# Patient Record
Sex: Female | Born: 1980 | Race: White | Hispanic: No | Marital: Single | Smoking: Never smoker
Health system: Southern US, Community
[De-identification: ages and names within clinical notes are randomized; demographics above are authoritative.]

## PROBLEM LIST (undated history)

## (undated) DIAGNOSIS — F329 Major depressive disorder, single episode, unspecified: Secondary | ICD-10-CM

## (undated) DIAGNOSIS — F32A Depression, unspecified: Secondary | ICD-10-CM

## (undated) DIAGNOSIS — J45909 Unspecified asthma, uncomplicated: Secondary | ICD-10-CM

## (undated) DIAGNOSIS — F419 Anxiety disorder, unspecified: Secondary | ICD-10-CM

## (undated) DIAGNOSIS — N938 Other specified abnormal uterine and vaginal bleeding: Secondary | ICD-10-CM

## (undated) DIAGNOSIS — R87810 Cervical high risk human papillomavirus (HPV) DNA test positive: Secondary | ICD-10-CM

## (undated) HISTORY — DX: Major depressive disorder, single episode, unspecified: F32.9

## (undated) HISTORY — DX: Unspecified asthma, uncomplicated: J45.909

## (undated) HISTORY — DX: Other specified abnormal uterine and vaginal bleeding: N93.8

## (undated) HISTORY — DX: Cervical high risk human papillomavirus (HPV) DNA test positive: R87.810

## (undated) HISTORY — DX: Depression, unspecified: F32.A

## (undated) HISTORY — DX: Anxiety disorder, unspecified: F41.9

---

## 2007-12-23 ENCOUNTER — Emergency Department: Payer: Self-pay | Admitting: Emergency Medicine
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
## 2010-04-09 ENCOUNTER — Emergency Department: Payer: Self-pay | Admitting: Emergency Medicine

## 2010-06-04 DIAGNOSIS — R87810 Cervical high risk human papillomavirus (HPV) DNA test positive: Secondary | ICD-10-CM

## 2010-06-04 HISTORY — DX: Cervical high risk human papillomavirus (HPV) DNA test positive: R87.810

## 2010-06-04 HISTORY — PX: COLPOSCOPY: SHX161

## 2010-08-07 ENCOUNTER — Emergency Department: Payer: Self-pay | Admitting: Emergency Medicine
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
## 2012-12-17 ENCOUNTER — Emergency Department: Payer: Self-pay | Admitting: Internal Medicine

## 2016-09-10 ENCOUNTER — Ambulatory Visit (INDEPENDENT_AMBULATORY_CARE_PROVIDER_SITE_OTHER): Payer: BLUE CROSS/BLUE SHIELD | Admitting: Obstetrics and Gynecology

## 2016-09-10 ENCOUNTER — Encounter: Payer: Self-pay | Admitting: Obstetrics and Gynecology

## 2016-09-10 VITALS — BP 130/88 | HR 99 | Ht 66.0 in | Wt 242.0 lb

## 2016-09-10 DIAGNOSIS — F418 Other specified anxiety disorders: Secondary | ICD-10-CM | POA: Diagnosis not present

## 2016-09-10 DIAGNOSIS — F329 Major depressive disorder, single episode, unspecified: Secondary | ICD-10-CM

## 2016-09-10 DIAGNOSIS — Z3042 Encounter for surveillance of injectable contraceptive: Secondary | ICD-10-CM

## 2016-09-10 DIAGNOSIS — F419 Anxiety disorder, unspecified: Secondary | ICD-10-CM

## 2016-09-10 DIAGNOSIS — Z01419 Encounter for gynecological examination (general) (routine) without abnormal findings: Secondary | ICD-10-CM | POA: Diagnosis not present

## 2016-09-10 MED ORDER — VENLAFAXINE HCL ER 150 MG PO CP24: 150.0000 mg | ORAL_CAPSULE | Freq: Every day | ORAL | 12 refills | Status: DC

## 2016-09-10 MED ORDER — MEDROXYPROGESTERONE ACETATE 150 MG/ML IM SUSY: 150.0000 mg | PREFILLED_SYRINGE | INTRAMUSCULAR | 3 refills | Status: DC

## 2016-09-10 NOTE — Progress Notes (Signed)
HPI:      [name] is a 36 y.o. G0P0000 who LMP was No LMP recorded. Patient has had an injection., presents today for her annual examination.  Her menses are absent on depo.  Dysmenorrhea none. She does not have intermenstrual bleeding.  Sex activity: not sexually active.  Last Pap: July 29, 2014  Results were: no abnormalities /neg HPV DNA  Hx of STDs: HPV   There is no FH of breast cancer. There is no FH of ovarian cancer. The patient does do self-breast exams.  Tobacco use: The patient denies current or previous tobacco use. Alcohol use: social drinker Exercise: moderately active  She does get adequate calcium and Vitamin D in her diet.  She has a hx of anxiety/depression/anger issues. She is doing well with effexor daily. She denies any side effects and wants to cont.   Past Medical History:  Diagnosis Date  . Anxiety and depression   . Asthma   . Cervical high risk human papillomavirus (HPV) DNA test positive 2012    Past Surgical History:  Procedure Laterality Date  . COLPOSCOPY  2012    Family History  Problem Relation Age of Onset  . Diabetes type II Father   . Schizophrenia Maternal Grandmother      ROS:  Review of Systems  Constitutional: Negative for fever, malaise/fatigue and weight loss.  HENT: Negative for congestion, ear pain and sinus pain.   Respiratory: Negative for cough, shortness of breath and wheezing.   Cardiovascular: Negative for chest pain, orthopnea and leg swelling.  Gastrointestinal: Negative for constipation, diarrhea, nausea and vomiting.  Genitourinary: Negative for dysuria, frequency, hematuria and urgency.       Breast ROS: negative   Musculoskeletal: Negative for back pain, joint pain and myalgias.  Skin: Negative for itching and rash.  Neurological: Negative for dizziness, tingling, focal weakness and headaches.  Endo/Heme/Allergies: Negative for environmental allergies. Does not bruise/bleed easily.    Psychiatric/Behavioral: Negative for depression and suicidal ideas. The patient is not nervous/anxious and does not have insomnia.     Objective: BP 130/88 (BP Location: Left Arm, Patient Position: Sitting, Cuff Size: Large)   Pulse 99   Ht  (1.676 m)   Wt 242 lb (109.8 kg)   BMI 39.06 kg/m    Physical Exam  Constitutional: She is oriented to person, place, and time. She appears well-developed and well-nourished.  Genitourinary: Vagina normal and uterus normal. No erythema or tenderness in the vagina. No vaginal discharge found. Right adnexum does not display mass and does not display tenderness. Left adnexum does not display mass and does not display tenderness. Cervix does not exhibit motion tenderness or polyp. Uterus is not enlarged or tender.  Neck: Normal range of motion. No thyromegaly present.  Cardiovascular: Normal rate, regular rhythm and normal heart sounds.   No murmur heard. Pulmonary/Chest: Effort normal and breath sounds normal. Right breast exhibits no mass, no nipple discharge, no skin change and no tenderness. Left breast exhibits no mass, no nipple discharge, no skin change and no tenderness.  Abdominal: Soft. There is no tenderness. There is no guarding.  Musculoskeletal: Normal range of motion.  Neurological: She is alert and oriented to person, place, and time. No cranial nerve deficit.  Psychiatric: She has a normal mood and affect. Her behavior is normal.  Vitals reviewed.   Assessment/Plan: Encounter for annual routine gynecological examination  Encounter for surveillance of injectable contraceptive - Plan: MedroxyPROGESTERone Acetate 150 MG/ML SUSY  Anxiety and depression - Doing well with effexor. Rx RF. F/u prn. - Plan: venlafaxine XR (EFFEXOR-XR) 150 MG 24 hr capsule              GYN counsel adequate intake of calcium and vitamin D, diet and exercise     F/U  Return in about 1 year (around 09/10/2017).  Alicia B. Copland,  PA-C 09/10/2016 11:24 AM

## 2017-09-10 ENCOUNTER — Encounter: Payer: Self-pay | Admitting: Obstetrics and Gynecology

## 2017-09-11 ENCOUNTER — Ambulatory Visit: Payer: BLUE CROSS/BLUE SHIELD | Admitting: Obstetrics and Gynecology

## 2017-09-11 ENCOUNTER — Ambulatory Visit (INDEPENDENT_AMBULATORY_CARE_PROVIDER_SITE_OTHER): Payer: PRIVATE HEALTH INSURANCE | Admitting: Obstetrics and Gynecology

## 2017-09-11 ENCOUNTER — Encounter: Payer: Self-pay | Admitting: Obstetrics and Gynecology

## 2017-09-11 VITALS — BP 124/84 | HR 90 | Ht 64.0 in | Wt 224.0 lb

## 2017-09-11 DIAGNOSIS — F329 Major depressive disorder, single episode, unspecified: Secondary | ICD-10-CM | POA: Diagnosis not present

## 2017-09-11 DIAGNOSIS — Z01419 Encounter for gynecological examination (general) (routine) without abnormal findings: Secondary | ICD-10-CM

## 2017-09-11 DIAGNOSIS — Z124 Encounter for screening for malignant neoplasm of cervix: Secondary | ICD-10-CM

## 2017-09-11 DIAGNOSIS — Z1151 Encounter for screening for human papillomavirus (HPV): Secondary | ICD-10-CM | POA: Diagnosis not present

## 2017-09-11 DIAGNOSIS — Z113 Encounter for screening for infections with a predominantly sexual mode of transmission: Secondary | ICD-10-CM

## 2017-09-11 DIAGNOSIS — Z3042 Encounter for surveillance of injectable contraceptive: Secondary | ICD-10-CM | POA: Diagnosis not present

## 2017-09-11 DIAGNOSIS — F419 Anxiety disorder, unspecified: Secondary | ICD-10-CM | POA: Diagnosis not present

## 2017-09-11 MED ORDER — SERTRALINE HCL 50 MG PO TABS: ORAL_TABLET | ORAL | 1 refills | Status: DC

## 2017-09-11 MED ORDER — MEDROXYPROGESTERONE ACETATE 150 MG/ML IM SUSY: 150.0000 mg | PREFILLED_SYRINGE | INTRAMUSCULAR | 3 refills | Status: DC

## 2017-09-11 NOTE — Progress Notes (Signed)
HPI:      [name] is a 37 y.o. G0P0000 who LMP was No LMP recorded. Patient has had an injection., presents today for her annual examination. Her menses are absent on depo.  Dysmenorrhea none. She does not have intermenstrual bleeding.  Sex activity: not sexually active but had 1 sex encounter with a condom a couple wks ago. Last Pap: July 29, 2014  Results were: no abnormalities /neg HPV DNA  Hx of STDs: HPV  There is no FH of breast cancer. There is no FH of ovarian cancer. The patient does do self-breast exams.  Tobacco use: The patient denies current or previous tobacco use. Alcohol use: social drinker Exercise: moderately active  She does not get adequate calcium and Vitamin D in her diet.  She has a hx of anxiety/depression/anger issues. She stopped effexor about 3-6 months ago due to lack of insurance. She would like to restart zoloft. She was on about 150 mg dose in past and feels it helped better with anxiety than effexor. Both have helped with depression sx. She denies any side effects with effexor.   Past Medical History:  Diagnosis Date  . Anxiety and depression   . Asthma   . Cervical high risk human papillomavirus (HPV) DNA test positive 2012  . Depression   . DUB (dysfunctional uterine bleeding)     Past Surgical History:  Procedure Laterality Date  . COLPOSCOPY  2012    Family History  Problem Relation Age of Onset  . Diabetes type II Father   . Schizophrenia Maternal Grandmother   . Hypertension Mother   . Breast cancer Other     Social History   Socioeconomic History  . Marital status: Single    Spouse name: Not on file  . Number of children: Not on file  . Years of education: Not on file  . Highest education level: Not on file  Occupational History  . Not on file  Social Needs  . Financial resource strain: Not on file  . Food insecurity:    Worry: Not on file    Inability: Not on file  . Transportation needs:   Medical: Not on file    Non-medical: Not on file  Tobacco Use  . Smoking status: Never Smoker  . Smokeless tobacco: Never Used  Substance and Sexual Activity  . Alcohol use: Yes  . Drug use: No  . Sexual activity: Not Currently    Birth control/protection: Injection  Lifestyle  . Physical activity:    Days per week: Not on file    Minutes per session: Not on file  . Stress: Not on file  Relationships  . Social connections:    Talks on phone: Not on file    Gets together: Not on file    Attends religious service: Not on file    Active member of club or organization: Not on file    Attends meetings of clubs or organizations: Not on file    Relationship status: Not on file  . Intimate partner violence:    Fear of current or ex partner: Not on file    Emotionally abused: Not on file    Physically abused: Not on file    Forced sexual activity: Not on file  Other Topics Concern  . Not on file  Social History Narrative  . Not on file    No current outpatient medications on file prior to visit.   No current facility-administered medications on  file prior to visit.     ROS:  Review of Systems  Constitutional: Positive for fatigue. Negative for fever and unexpected weight change.  Respiratory: Positive for cough. Negative for shortness of breath and wheezing.   Cardiovascular: Negative for chest pain, palpitations and leg swelling.  Gastrointestinal: Negative for blood in stool, constipation, diarrhea, nausea and vomiting.  Endocrine: Negative for cold intolerance, heat intolerance and polyuria.  Genitourinary: Negative for dyspareunia, dysuria, flank pain, frequency, genital sores, hematuria, menstrual problem, pelvic pain, urgency, vaginal bleeding, vaginal discharge and vaginal pain.  Musculoskeletal: Negative for back pain, joint swelling and myalgias.  Skin: Negative for rash.  Neurological: Positive for headaches. Negative for dizziness, syncope, light-headedness and  numbness.  Hematological: Negative for adenopathy.  Psychiatric/Behavioral: Positive for agitation and dysphoric mood. Negative for confusion, sleep disturbance and suicidal ideas. The patient is not nervous/anxious.      Objective: BP 124/84   Pulse 90   Ht 5\' 4"  (1.626 m)   Wt 224 lb (101.6 kg)   BMI 38.45 kg/m    Physical Exam  Constitutional: She is oriented to person, place, and time. She appears well-developed and well-nourished.  Genitourinary: Vagina normal and uterus normal. There is no rash or tenderness on the right labia. There is no rash or tenderness on the left labia. No erythema or tenderness in the vagina. No vaginal discharge found. Right adnexum does not display mass and does not display tenderness. Left adnexum does not display mass and does not display tenderness. Cervix does not exhibit motion tenderness or polyp. Uterus is not enlarged or tender.  Neck: Normal range of motion. No thyromegaly present.  Cardiovascular: Normal rate, regular rhythm and normal heart sounds.  No murmur heard. Pulmonary/Chest: Effort normal and breath sounds normal. Right breast exhibits no mass, no nipple discharge, no skin change and no tenderness. Left breast exhibits no mass, no nipple discharge, no skin change and no tenderness.  Abdominal: Soft. There is no tenderness. There is no guarding.  Musculoskeletal: Normal range of motion.  Neurological: She is alert and oriented to person, place, and time. No cranial nerve deficit.  Psychiatric: She has a normal mood and affect. Her behavior is normal.  Vitals reviewed.   Assessment/Plan: Encounter for annual routine gynecological examination  Cervical cancer screening - Plan: IGP,CtNgTv,Apt HPV,rfx16/18,45, CANCELED: IGP, Aptima HPV  Screening for HPV (human papillomavirus) - Plan: IGP,CtNgTv,Apt HPV,rfx16/18,45, CANCELED: IGP, Aptima HPV  Screening for STD (sexually transmitted disease) - Plan: IGP,CtNgTv,Apt  HPV,rfx16/18,45  Encounter for surveillance of injectable contraceptive - Rx RF depo.  - Plan: medroxyPROGESTERone Acetate 150 MG/ML SUSY  Anxiety and depression - Restart zoloft. Rx eRxd. F/u via phone after being on meds for 6 wks with sx f/u.  - Plan: sertraline (ZOLOFT) 50 MG tablet  Meds ordered this encounter  Medications  . medroxyPROGESTERone Acetate 150 MG/ML SUSY    Sig: Inject 1 mL (150 mg total) as directed every 3 (three) months.    Dispense:  1 Syringe    Refill:  3    Order Specific Question:   Supervising Provider    Answer:   Nadara Mustard [id number]  . sertraline (ZOLOFT) 50 MG tablet    Sig: Take 1/2 tab daily for 6 days, then 1 tab daily for 1 wk, then 2 tabs daily    Dispense:  60 tablet    Refill:  1    Order Specific Question:   Supervising Provider    Answer:   Nadara Mustard [  [id number]]             GYN counsel adequate intake of calcium and vitamin D, diet and exercise     F/U  Return in about 1 year (around 09/12/2018).  Lugenia Assefa B. Arjen Deringer, PA-C 09/11/2017 9:33 AM

## 2017-09-11 NOTE — Patient Instructions (Signed)
I value your feedback and entrusting us with your care. If you get a  patient survey, I would appreciate you taking the time to let us know about your experience today. Thank you! 

## 2017-09-14 LAB — IGP,CTNGTV,APT HPV,RFX16/18,45
CHLAMYDIA, NUC. ACID AMP: NEGATIVE
Gonococcus, Nuc. Acid Amp: NEGATIVE
HPV APTIMA: NEGATIVE
PAP SMEAR COMMENT: 0
Trich vag by NAA: NEGATIVE

## 2017-10-02 ENCOUNTER — Telehealth: Payer: Self-pay

## 2017-10-02 DIAGNOSIS — Z3042 Encounter for surveillance of injectable contraceptive: Secondary | ICD-10-CM

## 2017-10-02 DIAGNOSIS — F419 Anxiety disorder, unspecified: Principal | ICD-10-CM

## 2017-10-02 DIAGNOSIS — F32A Depression, unspecified: Secondary | ICD-10-CM

## 2017-10-02 DIAGNOSIS — F329 Major depressive disorder, single episode, unspecified: Secondary | ICD-10-CM

## 2017-10-02 MED ORDER — SERTRALINE HCL 50 MG PO TABS: ORAL_TABLET | ORAL | 1 refills | Status: DC

## 2017-10-02 MED ORDER — MEDROXYPROGESTERONE ACETATE 150 MG/ML IM SUSY: 150.0000 mg | PREFILLED_SYRINGE | INTRAMUSCULAR | 3 refills | Status: DC

## 2017-10-02 NOTE — Telephone Encounter (Signed)
Pt calling to see where we sent her rxs.  They were supposed to be sent to [facility]  [address].  [phone]  Sent rxs to this pharm today. Pt aware.

## 2017-10-31 ENCOUNTER — Other Ambulatory Visit: Payer: Self-pay | Admitting: Obstetrics and Gynecology

## 2017-10-31 ENCOUNTER — Telehealth: Payer: Self-pay

## 2017-10-31 DIAGNOSIS — F329 Major depressive disorder, single episode, unspecified: Secondary | ICD-10-CM

## 2017-10-31 DIAGNOSIS — F32A Depression, unspecified: Secondary | ICD-10-CM

## 2017-10-31 DIAGNOSIS — F419 Anxiety disorder, unspecified: Principal | ICD-10-CM

## 2017-10-31 MED ORDER — SERTRALINE HCL 100 MG PO TABS: ORAL_TABLET | ORAL | 0 refills | Status: DC

## 2017-10-31 NOTE — Telephone Encounter (Signed)
Pt can take 1 1/2 tabs daily (150 mg daily). Currently has 50 mg Rx and taking 2. I will send in 100 mg Rx to take 1 1/2. Pt to f/u via phone with me re: sx in 4 wks. Rx for 1 mo only. Thx.

## 2017-10-31 NOTE — Telephone Encounter (Signed)
Pt aware via vm , thank you

## 2017-10-31 NOTE — Telephone Encounter (Signed)
Pt calling triage requesting an increase on her Zoloft. Was told to call if she felt like she needed it to be increased. Please advise/increase Zoloft. I am here the rest of the day today then only half day Friday. If it is after noon Friday, send to [facility] to call pt back please.

## 2017-10-31 NOTE — Progress Notes (Signed)
Rx zoloft increase to 150 mg dose daily for sx

## 2017-11-20 ENCOUNTER — Telehealth: Payer: Self-pay

## 2017-11-20 NOTE — Telephone Encounter (Signed)
Pt is requesting an increase on her Zoloft from [facility]. She is currently out of meds & needs ASAP. Cb#[phone]

## 2017-11-21 NOTE — Telephone Encounter (Signed)
Does she need to be seen?

## 2017-11-21 NOTE — Telephone Encounter (Signed)
Pt taking 100 mg zoloft dose but wants to go back to 150 mg dose. I got a msg about that 10/31/17 and sent in the 150 mg dose, but pt never got msg or Rx. Pt to start 150 mg dose and f/u with sx. Controlled sx in past but wanted to increase gradually from 4/19 appt.

## 2018-01-06 ENCOUNTER — Encounter: Payer: Self-pay | Admitting: Obstetrics and Gynecology

## 2018-07-04 ENCOUNTER — Encounter: Payer: Self-pay | Admitting: Obstetrics and Gynecology

## 2018-08-16 ENCOUNTER — Encounter: Payer: Self-pay | Admitting: Obstetrics and Gynecology

## 2018-11-25 ENCOUNTER — Ambulatory Visit: Payer: BLUE CROSS/BLUE SHIELD | Admitting: Obstetrics and Gynecology

## 2019-01-04 ENCOUNTER — Encounter: Payer: Self-pay | Admitting: Obstetrics and Gynecology

## 2019-01-06 ENCOUNTER — Ambulatory Visit: Payer: BLUE CROSS/BLUE SHIELD | Admitting: Obstetrics and Gynecology

## 2019-01-13 ENCOUNTER — Ambulatory Visit: Payer: BLUE CROSS/BLUE SHIELD | Admitting: Obstetrics and Gynecology

## 2019-02-11 NOTE — Progress Notes (Deleted)
HPI:      [name] is a 38 y.o. G0P0000 who LMP was No LMP recorded. Patient has had an injection., presents today for her annual examination. Her menses are absent on depo.  Dysmenorrhea none. She does not have intermenstrual bleeding.  Sex activity: not sexually active but had 1 sex encounter with a condom a couple wks ago. Last Pap: 09/11/17  Results were: no abnormalities /neg HPV DNA  Hx of STDs: HPV  There is no FH of breast cancer. There is no FH of ovarian cancer. The patient does do self-breast exams.  Tobacco use: The patient denies current or previous tobacco use. Alcohol use: social drinker Exercise: moderately active  She does not get adequate calcium and Vitamin D in her diet.  She has a hx of anxiety/depression/anger issues. She stopped effexor about 3-6 months ago due to lack of insurance. She would like to restart zoloft. She was on about 150 mg dose in past and feels it helped better with anxiety than effexor. Both have helped with depression sx. She denies any side effects with effexor.   Past Medical History:  Diagnosis Date  . Anxiety and depression   . Asthma   . Cervical high risk human papillomavirus (HPV) DNA test positive 2012  . Depression   . DUB (dysfunctional uterine bleeding)     Past Surgical History:  Procedure Laterality Date  . COLPOSCOPY  2012    Family History  Problem Relation Age of Onset  . Diabetes type II Father   . Schizophrenia Maternal Grandmother   . Hypertension Mother   . Breast cancer Other     Social History   Socioeconomic History  . Marital status: Single    Spouse name: Not on file  . Number of children: Not on file  . Years of education: Not on file  . Highest education level: Not on file  Occupational History  . Not on file  Social Needs  . Financial resource strain: Not on file  . Food insecurity    Worry: Not on file    Inability: Not on file  . Transportation needs    Medical: Not on  file    Non-medical: Not on file  Tobacco Use  . Smoking status: Never Smoker  . Smokeless tobacco: Never Used  Substance and Sexual Activity  . Alcohol use: Yes  . Drug use: No  . Sexual activity: Not Currently    Birth control/protection: Injection  Lifestyle  . Physical activity    Days per week: Not on file    Minutes per session: Not on file  . Stress: Not on file  Relationships  . Social Musicianconnections    Talks on phone: Not on file    Gets together: Not on file    Attends religious service: Not on file    Active member of club or organization: Not on file    Attends meetings of clubs or organizations: Not on file    Relationship status: Not on file  . Intimate partner violence    Fear of current or ex partner: Not on file    Emotionally abused: Not on file    Physically abused: Not on file    Forced sexual activity: Not on file  Other Topics Concern  . Not on file  Social History Narrative  . Not on file    Current Outpatient Medications on File Prior to Visit  Medication Sig Dispense Refill  . medroxyPROGESTERone  Acetate 150 MG/ML SUSY Inject 1 mL (150 mg total) as directed every 3 (three) months. 1 Syringe 3  . sertraline (ZOLOFT) 100 MG tablet Take 1 1/2 tabs daily 45 tablet 0   No current facility-administered medications on file prior to visit.     ROS:  Review of Systems  Constitutional: Positive for fatigue. Negative for fever and unexpected weight change.  Respiratory: Positive for cough. Negative for shortness of breath and wheezing.   Cardiovascular: Negative for chest pain, palpitations and leg swelling.  Gastrointestinal: Negative for blood in stool, constipation, diarrhea, nausea and vomiting.  Endocrine: Negative for cold intolerance, heat intolerance and polyuria.  Genitourinary: Negative for dyspareunia, dysuria, flank pain, frequency, genital sores, hematuria, menstrual problem, pelvic pain, urgency, vaginal bleeding, vaginal discharge and  vaginal pain.  Musculoskeletal: Negative for back pain, joint swelling and myalgias.  Skin: Negative for rash.  Neurological: Positive for headaches. Negative for dizziness, syncope, light-headedness and numbness.  Hematological: Negative for adenopathy.  Psychiatric/Behavioral: Positive for agitation and dysphoric mood. Negative for confusion, sleep disturbance and suicidal ideas. The patient is not nervous/anxious.      Objective: There were no vitals taken for this visit.   Physical Exam Constitutional:      Appearance: She is well-developed.  Genitourinary:     Vagina and uterus normal.     No vaginal discharge, erythema or tenderness.     No cervical motion tenderness or polyp.     Uterus is not enlarged or tender.     No right or left adnexal mass present.     Right adnexa not tender.     Left adnexa not tender.  Neck:     Musculoskeletal: Normal range of motion.     Thyroid: No thyromegaly.  Cardiovascular:     Rate and Rhythm: Normal rate and regular rhythm.     Heart sounds: Normal heart sounds. No murmur.  Pulmonary:     Effort: Pulmonary effort is normal.     Breath sounds: Normal breath sounds.  Chest:     Breasts:        Right: No mass, nipple discharge, skin change or tenderness.        Left: No mass, nipple discharge, skin change or tenderness.  Abdominal:     Palpations: Abdomen is soft.     Tenderness: There is no abdominal tenderness. There is no guarding.  Musculoskeletal: Normal range of motion.  Neurological:     Mental Status: She is alert and oriented to person, place, and time.     Cranial Nerves: No cranial nerve deficit.  Psychiatric:        Behavior: Behavior normal.  Vitals signs reviewed.     Assessment/Plan: No diagnosis found.  No orders of the defined types were placed in this encounter.            GYN counsel adequate intake of calcium and vitamin D, diet and exercise     F/U  No follow-ups on file.  Arad Burston B. Averiana Clouatre,  PA-C 02/11/2019 3:12 PM

## 2019-02-12 ENCOUNTER — Ambulatory Visit: Payer: BLUE CROSS/BLUE SHIELD | Admitting: Obstetrics and Gynecology

## 2019-06-03 NOTE — Progress Notes (Signed)
Chief Complaint  Patient presents with  . Gynecologic Exam     HPI:      [name] is a 38 y.o. G0P0000 who LMP was Patient's last menstrual period was 05/29/2019 (exact date)., presents today for her annual examination. Her menses are monthly now off depo, lasting 3-5 days, with dime sized clots, med flow.  Dysmenorrhea mod to severe (for pt), improved with NSAIDs. She does not have intermenstrual bleeding.  Complains of severe vaginal itching before menses and then a few days afterwards. Wears pads and sx occur if flow is heavier that cycle. Has vaginal d/c, too. Treats with OTC monistat with temporary relief. Using unscented soaps, pads, no dryer sheets.  Sex activity: occas sexually active, using condoms. Declines BC for now. Last Pap: 09/11/17  Results were: no abnormalities /neg HPV DNA  Hx of STDs: HPV  There is no FH of breast cancer. There is no FH of ovarian cancer. The patient does do self-breast exams.  Tobacco use: The patient denies current or previous tobacco use. Alcohol use: social drinker No drug use Exercise: moderately active  She does get adequate calcium and Vitamin D in her diet.  She has a hx of anxiety/depression/anger issues. She stopped zoloft awhile back. Starting to have panic attacks again, mind doesn't shut off, and trouble sleeping. Taking melatonin without relief. Thinks she needs to restart sertraline again which worked well in past at 150 mg dose. Did effexor but didn't work as well as zoloft.   No recent labs  Past Medical History:  Diagnosis Date  . Anxiety and depression   . Asthma   . Cervical high risk human papillomavirus (HPV) DNA test positive 2012  . Depression   . DUB (dysfunctional uterine bleeding)     Past Surgical History:  Procedure Laterality Date  . COLPOSCOPY  2012    Family History  Problem Relation Age of Onset  . Diabetes type II Father   . Schizophrenia Maternal Grandmother   . Hypertension  Mother   . Breast cancer Other     Social History   Socioeconomic History  . Marital status: Single    Spouse name: Not on file  . Number of children: Not on file  . Years of education: Not on file  . Highest education level: Not on file  Occupational History  . Not on file  Tobacco Use  . Smoking status: Never Smoker  . Smokeless tobacco: Never Used  Substance and Sexual Activity  . Alcohol use: Yes  . Drug use: No  . Sexual activity: Yes    Birth control/protection: None  Other Topics Concern  . Not on file  Social History Narrative  . Not on file   Social Determinants of Health   Financial Resource Strain:   . Difficulty of Paying Living Expenses: Not on file  Food Insecurity:   . Worried About Programme researcher, broadcasting/film/videounning Out of Food in the Last Year: Not on file  . Ran Out of Food in the Last Year: Not on file  Transportation Needs:   . Lack of Transportation (Medical): Not on file  . Lack of Transportation (Non-Medical): Not on file  Physical Activity:   . Days of Exercise per Week: Not on file  . Minutes of Exercise per Session: Not on file  Stress:   . Feeling of Stress : Not on file  Social Connections:   . Frequency of Communication with Friends and Family: Not on file  . Frequency  of Social Gatherings with Friends and Family: Not on file  . Attends Religious Services: Not on file  . Active Member of Clubs or Organizations: Not on file  . Attends Archivist Meetings: Not on file  . Marital Status: Not on file  Intimate Partner Violence:   . Fear of Current or Ex-Partner: Not on file  . Emotionally Abused: Not on file  . Physically Abused: Not on file  . Sexually Abused: Not on file    No current outpatient medications on file prior to visit.   No current facility-administered medications on file prior to visit.    ROS:  Review of Systems  Constitutional: Positive for fatigue. Negative for fever and unexpected weight change.  Respiratory: Negative for  cough, shortness of breath and wheezing.   Cardiovascular: Negative for chest pain, palpitations and leg swelling.  Gastrointestinal: Negative for blood in stool, constipation, diarrhea, nausea and vomiting.  Endocrine: Negative for cold intolerance, heat intolerance and polyuria.  Genitourinary: Positive for vaginal discharge. Negative for dyspareunia, dysuria, flank pain, frequency, genital sores, hematuria, menstrual problem, pelvic pain, urgency, vaginal bleeding and vaginal pain.  Musculoskeletal: Negative for back pain, joint swelling and myalgias.  Skin: Negative for rash.  Neurological: Positive for headaches. Negative for dizziness, syncope, light-headedness and numbness.  Hematological: Negative for adenopathy.  Psychiatric/Behavioral: Positive for agitation, dysphoric mood and sleep disturbance. Negative for confusion and suicidal ideas. The patient is not nervous/anxious.      Objective: BP (!) 142/88   Ht 5\' 6"  (1.676 m)   Wt 202 lb (91.6 kg)   LMP 05/29/2019 (Exact Date)   BMI 32.60 kg/m    Physical Exam Constitutional:      Appearance: She is well-developed.  Genitourinary:     Vulva, vagina, uterus, right adnexa and left adnexa normal.     Vulval rash present.     No vulval lesion or tenderness noted.        No vaginal discharge, erythema or tenderness.     No cervical motion tenderness or polyp.     Uterus is not enlarged or tender.     No right or left adnexal mass present.     Right adnexa not tender.     Left adnexa not tender.  Neck:     Thyroid: No thyromegaly.  Cardiovascular:     Rate and Rhythm: Normal rate and regular rhythm.     Heart sounds: Normal heart sounds. No murmur.  Pulmonary:     Effort: Pulmonary effort is normal.     Breath sounds: Normal breath sounds.  Chest:     Breasts:        Right: No mass, nipple discharge, skin change or tenderness.        Left: No mass, nipple discharge, skin change or tenderness.  Abdominal:      Palpations: Abdomen is soft.     Tenderness: There is no abdominal tenderness. There is no guarding.  Musculoskeletal:        General: Normal range of motion.     Cervical back: Normal range of motion.  Neurological:     General: No focal deficit present.     Mental Status: She is alert and oriented to person, place, and time.     Cranial Nerves: No cranial nerve deficit.  Skin:    General: Skin is warm and dry.  Psychiatric:        Mood and Affect: Mood normal.  Behavior: Behavior normal.        Thought Content: Thought content normal.        Judgment: Judgment normal.  Vitals reviewed.     Assessment/Plan: Encounter for annual routine gynecological examination  Screening for STD (sexually transmitted disease) - Plan: [facility] STD  Anxiety and depression - Restart zoloft. Rx eRxd. F/u via phone after being on meds for 2 months with sx f/u. Thinks she may only need 100 mg dose for now. Will re-eval then  - Plan: sertraline (ZOLOFT) 50 MG tablet  Acute vaginitis - Plan: clotrimazole-betamethasone (LOTRISONE) cream; In vulvar area, looks fungal. Rx lotrisone crm. Keep area dry/try desitin/A&D. F/u prn.   Blood tests for routine general physical examination - Plan: Comprehensive metabolic panel, Lipid panel, Hemoglobin A1c  Screening cholesterol level - Plan: Lipid panel  Screening for diabetes mellitus - Plan: Hemoglobin A1c   Meds ordered this encounter  Medications  . sertraline (ZOLOFT) 50 MG tablet    Sig: Take 1/2 tab daily for 6 days, then 1 tab (50 mg) daily for 2 wks, then 2 tabs (100 mg)  daily    Dispense:  90 tablet    Refill:  0    Order Specific Question:   Supervising Provider    Answer:   Nadara Mustard [id number]  . clotrimazole-betamethasone (LOTRISONE) cream    Sig: Apply externally BID prn sx up to 2 wks    Dispense:  45 g    Refill:  0    Order Specific Question:   Supervising Provider    Answer:   Nadara Mustard [[id number]]             GYN counsel  adequate intake of calcium and vitamin D, diet and exercise     F/U  Return in about 1 year (around 06/03/2020).  Eldonna Neuenfeldt B. Almir Botts, PA-C 06/04/2019 10:23 AM

## 2019-06-03 NOTE — Patient Instructions (Signed)
I value your feedback and entrusting us with your care. If you get a [facility] patient survey, I would appreciate you taking the time to let us know about your experience today. Thank you!  As of May 14, 2019, your lab results will be released to your MyChart immediately, before I even have a chance to see them. Please give me time to review them and contact you if there are any abnormalities. Thank you for your patience.  

## 2019-06-04 ENCOUNTER — Other Ambulatory Visit: Payer: Self-pay

## 2019-06-04 ENCOUNTER — Ambulatory Visit (INDEPENDENT_AMBULATORY_CARE_PROVIDER_SITE_OTHER): Payer: BLUE CROSS/BLUE SHIELD | Admitting: Obstetrics and Gynecology

## 2019-06-04 ENCOUNTER — Other Ambulatory Visit (HOSPITAL_COMMUNITY)
Admission: RE | Admit: 2019-06-04 | Discharge: 2019-06-04 | Disposition: A | Discharge status: Home / Self Care | Payer: BLUE CROSS/BLUE SHIELD | Source: Ambulatory Visit | Attending: Obstetrics and Gynecology | Admitting: Obstetrics and Gynecology

## 2019-06-04 ENCOUNTER — Encounter: Payer: Self-pay | Admitting: Obstetrics and Gynecology

## 2019-06-04 VITALS — BP 142/88 | Ht 66.0 in | Wt 202.0 lb

## 2019-06-04 DIAGNOSIS — N760 Acute vaginitis: Secondary | ICD-10-CM

## 2019-06-04 DIAGNOSIS — Z113 Encounter for screening for infections with a predominantly sexual mode of transmission: Secondary | ICD-10-CM | POA: Diagnosis not present

## 2019-06-04 DIAGNOSIS — Z0000 Encounter for general adult medical examination without abnormal findings: Secondary | ICD-10-CM

## 2019-06-04 DIAGNOSIS — Z13220 Encounter for screening for lipoid disorders: Secondary | ICD-10-CM

## 2019-06-04 DIAGNOSIS — F32A Depression, unspecified: Secondary | ICD-10-CM

## 2019-06-04 DIAGNOSIS — Z01419 Encounter for gynecological examination (general) (routine) without abnormal findings: Secondary | ICD-10-CM

## 2019-06-04 DIAGNOSIS — F329 Major depressive disorder, single episode, unspecified: Secondary | ICD-10-CM

## 2019-06-04 DIAGNOSIS — Z131 Encounter for screening for diabetes mellitus: Secondary | ICD-10-CM

## 2019-06-04 MED ORDER — CLOTRIMAZOLE-BETAMETHASONE 1-0.05 % EX CREA: TOPICAL_CREAM | CUTANEOUS | 0 refills | Status: AC

## 2019-06-04 MED ORDER — SERTRALINE HCL 50 MG PO TABS: ORAL_TABLET | ORAL | 0 refills | Status: DC

## 2019-06-08 LAB — CERVICOVAGINAL ANCILLARY ONLY
Chlamydia: NEGATIVE
Comment: NEGATIVE
Comment: NORMAL
Neisseria Gonorrhea: NEGATIVE

## 2019-06-11 ENCOUNTER — Other Ambulatory Visit: Payer: Self-pay | Admitting: Obstetrics and Gynecology

## 2019-06-11 ENCOUNTER — Encounter: Payer: Self-pay | Admitting: Obstetrics and Gynecology

## 2019-06-11 MED ORDER — TRAZODONE HCL 50 MG PO TABS: 50.0000 mg | ORAL_TABLET | Freq: Every day | ORAL | 0 refills | Status: DC

## 2019-06-11 NOTE — Progress Notes (Signed)
Rx trazodone for insomnia due to anxiety. Just restarted zoloft.

## 2019-06-24 DIAGNOSIS — Z20828 Contact with and (suspected) exposure to other viral communicable diseases: Secondary | ICD-10-CM | POA: Diagnosis not present

## 2019-07-01 ENCOUNTER — Encounter: Payer: Self-pay | Admitting: Obstetrics and Gynecology

## 2019-07-01 ENCOUNTER — Other Ambulatory Visit: Payer: Self-pay | Admitting: Obstetrics and Gynecology

## 2019-07-01 DIAGNOSIS — F419 Anxiety disorder, unspecified: Secondary | ICD-10-CM

## 2019-07-01 DIAGNOSIS — F329 Major depressive disorder, single episode, unspecified: Secondary | ICD-10-CM

## 2019-07-01 MED ORDER — SERTRALINE HCL 100 MG PO TABS: 150.0000 mg | ORAL_TABLET | Freq: Every day | ORAL | 0 refills | Status: DC

## 2019-07-01 NOTE — Progress Notes (Signed)
Rx zoloft increase to 150 mg dose daily. Rx eRxd.

## 2019-07-03 ENCOUNTER — Encounter: Payer: Self-pay | Admitting: Obstetrics and Gynecology

## 2019-07-09 ENCOUNTER — Encounter: Payer: Self-pay | Admitting: Obstetrics and Gynecology

## 2019-07-09 ENCOUNTER — Other Ambulatory Visit: Payer: Self-pay | Admitting: Obstetrics and Gynecology

## 2019-07-09 MED ORDER — TRAZODONE HCL 50 MG PO TABS: 50.0000 mg | ORAL_TABLET | Freq: Every day | ORAL | 1 refills | Status: DC

## 2019-07-09 NOTE — Progress Notes (Signed)
Rx RF trazodone prn sleep 

## 2019-07-29 ENCOUNTER — Encounter: Payer: Self-pay | Admitting: Obstetrics and Gynecology

## 2019-08-17 ENCOUNTER — Encounter: Payer: Self-pay | Admitting: Obstetrics and Gynecology

## 2019-08-17 NOTE — Telephone Encounter (Signed)
Pls call pharm to confirm pt doesn't have extra RF from Rx sent 2/21. If not, I'll send in RF. Thx.

## 2019-09-03 ENCOUNTER — Encounter: Payer: Self-pay | Admitting: Obstetrics and Gynecology

## 2019-09-08 ENCOUNTER — Other Ambulatory Visit: Payer: Self-pay | Admitting: Obstetrics and Gynecology

## 2019-09-08 DIAGNOSIS — F419 Anxiety disorder, unspecified: Secondary | ICD-10-CM

## 2019-09-08 DIAGNOSIS — F329 Major depressive disorder, single episode, unspecified: Secondary | ICD-10-CM

## 2019-09-08 MED ORDER — SERTRALINE HCL 100 MG PO TABS: 150.0000 mg | ORAL_TABLET | Freq: Every day | ORAL | 0 refills | Status: DC

## 2019-09-08 NOTE — Progress Notes (Signed)
Rx RF zoloft 

## 2019-11-04 ENCOUNTER — Encounter: Payer: Self-pay | Admitting: Obstetrics and Gynecology

## 2019-11-05 ENCOUNTER — Other Ambulatory Visit: Payer: Self-pay | Admitting: Obstetrics and Gynecology

## 2019-11-05 MED ORDER — TRAZODONE HCL 50 MG PO TABS: 50.0000 mg | ORAL_TABLET | Freq: Every day | ORAL | 1 refills | Status: AC

## 2019-11-05 NOTE — Progress Notes (Signed)
Rx RF trazodone prn insomnia.

## 2019-11-11 ENCOUNTER — Encounter: Payer: Self-pay | Admitting: Obstetrics and Gynecology

## 2019-11-12 ENCOUNTER — Other Ambulatory Visit: Payer: Self-pay | Admitting: Obstetrics and Gynecology

## 2019-11-12 MED ORDER — FLUCONAZOLE 150 MG PO TABS: 150.0000 mg | ORAL_TABLET | Freq: Once | ORAL | 0 refills | Status: AC

## 2019-11-12 NOTE — Progress Notes (Signed)
Rx diflucan for yeast vag sx. F/u prn

## 2019-11-29 ENCOUNTER — Encounter: Payer: Self-pay | Admitting: Obstetrics and Gynecology

## 2019-11-30 MED ORDER — TERCONAZOLE 0.8 % VA CREA: 1.0000 | TOPICAL_CREAM | Freq: Every day | VAGINAL | 0 refills | Status: AC

## 2019-12-01 ENCOUNTER — Encounter: Payer: Self-pay | Admitting: Obstetrics and Gynecology

## 2019-12-24 ENCOUNTER — Encounter: Payer: Self-pay | Admitting: Obstetrics and Gynecology

## 2019-12-25 ENCOUNTER — Other Ambulatory Visit: Payer: Self-pay | Admitting: Obstetrics and Gynecology

## 2019-12-25 DIAGNOSIS — F329 Major depressive disorder, single episode, unspecified: Secondary | ICD-10-CM

## 2019-12-25 DIAGNOSIS — F419 Anxiety disorder, unspecified: Secondary | ICD-10-CM

## 2019-12-25 MED ORDER — SERTRALINE HCL 100 MG PO TABS: 150.0000 mg | ORAL_TABLET | Freq: Every day | ORAL | 1 refills | Status: AC

## 2019-12-25 NOTE — Progress Notes (Signed)
Rx RF zoloft

## 2019-12-28 ENCOUNTER — Other Ambulatory Visit: Payer: Self-pay | Admitting: Obstetrics and Gynecology

## 2019-12-28 DIAGNOSIS — Z131 Encounter for screening for diabetes mellitus: Secondary | ICD-10-CM

## 2019-12-28 DIAGNOSIS — Z0000 Encounter for general adult medical examination without abnormal findings: Secondary | ICD-10-CM

## 2019-12-28 DIAGNOSIS — Z13220 Encounter for screening for lipoid disorders: Secondary | ICD-10-CM

## 2019-12-28 DIAGNOSIS — Z1329 Encounter for screening for other suspected endocrine disorder: Secondary | ICD-10-CM

## 2020-01-01 ENCOUNTER — Other Ambulatory Visit: Payer: BLUE CROSS/BLUE SHIELD

## 2020-01-01 ENCOUNTER — Other Ambulatory Visit: Payer: Self-pay

## 2020-01-01 DIAGNOSIS — Z131 Encounter for screening for diabetes mellitus: Secondary | ICD-10-CM

## 2020-01-01 DIAGNOSIS — Z13220 Encounter for screening for lipoid disorders: Secondary | ICD-10-CM | POA: Diagnosis not present

## 2020-01-01 DIAGNOSIS — Z1329 Encounter for screening for other suspected endocrine disorder: Secondary | ICD-10-CM | POA: Diagnosis not present

## 2020-01-01 DIAGNOSIS — Z0000 Encounter for general adult medical examination without abnormal findings: Secondary | ICD-10-CM

## 2020-01-02 LAB — COMPREHENSIVE METABOLIC PANEL
ALT: 20 IU/L (ref 0–32)
AST: 20 IU/L (ref 0–40)
Albumin/Globulin Ratio: 1.7 (ref 1.2–2.2)
Albumin: 4.1 g/dL (ref 3.8–4.8)
Alkaline Phosphatase: 77 IU/L (ref 48–121)
BUN/Creatinine Ratio: 14 (ref 9–23)
BUN: 9 mg/dL (ref 6–20)
Bilirubin Total: <0.2 mg/dL (ref 0.0–1.2)
CO2: 22 mmol/L (ref 20–29)
Calcium: 9.5 mg/dL (ref 8.7–10.2)
Chloride: 104 mmol/L (ref 96–106)
Creatinine, Ser: 0.64 mg/dL (ref 0.57–1.00)
GFR calc Af Amer: 130 mL/min/{1.73_m2} (ref >59)
GFR calc non Af Amer: 113 mL/min/{1.73_m2} (ref >59)
Globulin, Total: 2.4 g/dL (ref 1.5–4.5)
Glucose: 88 mg/dL (ref 65–99)
Potassium: 4.3 mmol/L (ref 3.5–5.2)
Sodium: 139 mmol/L (ref 134–144)
Total Protein: 6.5 g/dL (ref 6.0–8.5)

## 2020-01-02 LAB — HEMOGLOBIN A1C
Est. average glucose Bld gHb Est-mCnc: 97 mg/dL
Hgb A1c MFr Bld: 5 % (ref 4.8–5.6)

## 2020-01-02 LAB — LIPID PANEL
Chol/HDL Ratio: 3.6 ratio (ref 0.0–4.4)
Cholesterol, Total: 180 mg/dL (ref 100–199)
HDL: 50 mg/dL (ref >39)
LDL Chol Calc (NIH): 111 mg/dL — ABNORMAL HIGH (ref 0–99)
Triglycerides: 107 mg/dL (ref 0–149)
VLDL Cholesterol Cal: 19 mg/dL (ref 5–40)

## 2020-01-02 LAB — TSH+FREE T4
Free T4: 1.05 ng/dL (ref 0.82–1.77)
TSH: 1.5 u[IU]/mL (ref 0.450–4.500)

## 2020-01-08 ENCOUNTER — Other Ambulatory Visit: Payer: BLUE CROSS/BLUE SHIELD

## 2020-01-27 ENCOUNTER — Encounter: Payer: Self-pay | Admitting: Obstetrics and Gynecology

## 2020-03-11 ENCOUNTER — Encounter: Payer: Self-pay | Admitting: Obstetrics and Gynecology

## 2020-03-17 ENCOUNTER — Other Ambulatory Visit: Payer: Self-pay | Admitting: Obstetrics and Gynecology

## 2020-03-17 DIAGNOSIS — F419 Anxiety disorder, unspecified: Secondary | ICD-10-CM

## 2020-03-17 DIAGNOSIS — F32A Depression, unspecified: Secondary | ICD-10-CM

## 2020-06-18 ENCOUNTER — Other Ambulatory Visit: Payer: 59

## 2020-06-18 DIAGNOSIS — Z20822 Contact with and (suspected) exposure to covid-19: Secondary | ICD-10-CM

## 2020-06-20 ENCOUNTER — Other Ambulatory Visit: Payer: Self-pay

## 2020-06-21 LAB — NOVEL CORONAVIRUS, NAA: SARS-CoV-2, NAA: NOT DETECTED

## 2020-08-22 ENCOUNTER — Ambulatory Visit: Payer: BLUE CROSS/BLUE SHIELD | Admitting: Obstetrics and Gynecology

## 2020-09-18 ENCOUNTER — Encounter: Payer: Self-pay | Admitting: Obstetrics and Gynecology

## 2020-09-18 NOTE — Progress Notes (Deleted)
No chief complaint on file.    HPI:      [name] is a 40 y.o. G0P0000 who LMP was No LMP recorded., presents today for her annual examination. Her menses are monthly now off depo, lasting 3-5 days, with dime sized clots, med flow.  Dysmenorrhea mod to severe (for pt), improved with NSAIDs. She does not have intermenstrual bleeding.  Complains of severe vaginal itching before menses and then a few days afterwards. Wears pads and sx occur if flow is heavier that cycle. Has vaginal d/c, too. Treats with OTC monistat with temporary relief. Using unscented soaps, pads, no dryer sheets.  Sex activity: occas sexually active, using condoms. Declines BC for now. Last Pap: 09/11/17  Results were: no abnormalities /neg HPV DNA  Hx of STDs: HPV  There is no FH of breast cancer. There is no FH of ovarian cancer. The patient does do self-breast exams.  Tobacco use: The patient denies current or previous tobacco use. Alcohol use: social drinker No drug use Exercise: moderately active  She does get adequate calcium and Vitamin D in her diet.  She has a hx of anxiety/depression/anger issues. She stopped zoloft awhile back. Starting to have panic attacks again, mind doesn't shut off, and trouble sleeping. Taking melatonin without relief. Thinks she needs to restart sertraline again which worked well in past at 150 mg dose. Did effexor but didn't work as well as zoloft.   No recent labs  Past Medical History:  Diagnosis Date  . Anxiety and depression   . Asthma   . Cervical high risk human papillomavirus (HPV) DNA test positive 2012  . Depression   . DUB (dysfunctional uterine bleeding)     Past Surgical History:  Procedure Laterality Date  . COLPOSCOPY  2012    Family History  Problem Relation Age of Onset  . Diabetes type II Father   . Schizophrenia Maternal Grandmother   . Hypertension Mother   . Breast cancer Other     Social History   Socioeconomic History   . Marital status: Single    Spouse name: Not on file  . Number of children: Not on file  . Years of education: Not on file  . Highest education level: Not on file  Occupational History  . Not on file  Tobacco Use  . Smoking status: Never Smoker  . Smokeless tobacco: Never Used  Vaping Use  . Vaping Use: Never used  Substance and Sexual Activity  . Alcohol use: Yes  . Drug use: No  . Sexual activity: Yes    Birth control/protection: None  Other Topics Concern  . Not on file  Social History Narrative  . Not on file   Social Determinants of Health   Financial Resource Strain: Not on file  Food Insecurity: Not on file  Transportation Needs: Not on file  Physical Activity: Not on file  Stress: Not on file  Social Connections: Not on file  Intimate Partner Violence: Not on file    Current Outpatient Medications on File Prior to Visit  Medication Sig Dispense Refill  . clotrimazole-betamethasone (LOTRISONE) cream Apply externally BID prn sx up to 2 wks 45 g 0  . sertraline (ZOLOFT) 100 MG tablet Take 1.5 tablets (150 mg total) by mouth daily. 135 tablet 1  . traZODone (DESYREL) 50 MG tablet Take 1 tablet (50 mg total) by mouth at bedtime. 30 tablet 1   No current facility-administered medications on file prior to visit.  ROS:  Review of Systems  Constitutional: Positive for fatigue. Negative for fever and unexpected weight change.  Respiratory: Negative for cough, shortness of breath and wheezing.   Cardiovascular: Negative for chest pain, palpitations and leg swelling.  Gastrointestinal: Negative for blood in stool, constipation, diarrhea, nausea and vomiting.  Endocrine: Negative for cold intolerance, heat intolerance and polyuria.  Genitourinary: Positive for vaginal discharge. Negative for dyspareunia, dysuria, flank pain, frequency, genital sores, hematuria, menstrual problem, pelvic pain, urgency, vaginal bleeding and vaginal pain.  Musculoskeletal: Negative for  back pain, joint swelling and myalgias.  Skin: Negative for rash.  Neurological: Positive for headaches. Negative for dizziness, syncope, light-headedness and numbness.  Hematological: Negative for adenopathy.  Psychiatric/Behavioral: Positive for agitation, dysphoric mood and sleep disturbance. Negative for confusion and suicidal ideas. The patient is not nervous/anxious.      Objective: There were no vitals taken for this visit.   Physical Exam Constitutional:      Appearance: She is well-developed.  Genitourinary:     Vulva normal.     No vaginal discharge, erythema or tenderness.      Right Adnexa: not tender and no mass present.    Left Adnexa: not tender and no mass present.    No cervical motion tenderness or polyp.     Uterus is not enlarged or tender.  Breasts:     Right: No mass, nipple discharge, skin change or tenderness.     Left: No mass, nipple discharge, skin change or tenderness.    Neck:     Thyroid: No thyromegaly.  Cardiovascular:     Rate and Rhythm: Normal rate and regular rhythm.     Heart sounds: Normal heart sounds. No murmur heard.   Pulmonary:     Effort: Pulmonary effort is normal.     Breath sounds: Normal breath sounds.  Abdominal:     Palpations: Abdomen is soft.     Tenderness: There is no abdominal tenderness. There is no guarding.  Musculoskeletal:        General: Normal range of motion.     Cervical back: Normal range of motion.  Neurological:     General: No focal deficit present.     Mental Status: She is alert and oriented to person, place, and time.     Cranial Nerves: No cranial nerve deficit.  Skin:    General: Skin is warm and dry.  Psychiatric:        Mood and Affect: Mood normal.        Behavior: Behavior normal.        Thought Content: Thought content normal.        Judgment: Judgment normal.  Vitals reviewed.     Assessment/Plan: Encounter for annual routine gynecological examination  Screening for STD  (sexually transmitted disease) - Plan: [facility] STD  Anxiety and depression - Restart zoloft. Rx eRxd. F/u via phone after being on meds for 2 months with sx f/u. Thinks she may only need 100 mg dose for now. Will re-eval then  - Plan: sertraline (ZOLOFT) 50 MG tablet  Acute vaginitis - Plan: clotrimazole-betamethasone (LOTRISONE) cream; In vulvar area, looks fungal. Rx lotrisone crm. Keep area dry/try desitin/A&D. F/u prn.   Blood tests for routine general physical examination - Plan: Comprehensive metabolic panel, Lipid panel, Hemoglobin A1c  Screening cholesterol level - Plan: Lipid panel  Screening for diabetes mellitus - Plan: Hemoglobin A1c   No orders of the defined types were placed in this encounter.  GYN counsel adequate intake of calcium and vitamin D, diet and exercise     F/U  No follow-ups on file.  Khasir Woodrome B. Juan Olthoff, PA-C 09/18/2020 8:40 PM

## 2020-09-19 ENCOUNTER — Ambulatory Visit: Payer: Self-pay | Admitting: Obstetrics and Gynecology

## 2020-09-19 DIAGNOSIS — F419 Anxiety disorder, unspecified: Secondary | ICD-10-CM

## 2020-09-19 DIAGNOSIS — Z01419 Encounter for gynecological examination (general) (routine) without abnormal findings: Secondary | ICD-10-CM

## 2020-10-18 ENCOUNTER — Ambulatory Visit: Payer: Self-pay | Admitting: Obstetrics and Gynecology

## 2020-11-03 NOTE — Progress Notes (Deleted)
No chief complaint on file.    HPI:      Rebecca Shah is a 40 y.o. G0P0000 who LMP was No LMP recorded., presents today for her annual examination. Her menses are monthly now off depo, lasting 3-5 days, with dime sized clots, med flow.  Dysmenorrhea mod to severe (for pt), improved with NSAIDs. She does not have intermenstrual bleeding.  Complains of severe vaginal itching before menses and then a few days afterwards. Wears pads and sx occur if flow is heavier that cycle. Has vaginal d/c, too. Treats with OTC monistat with temporary relief. Using unscented soaps, pads, no dryer sheets.  Sex activity: occas sexually active, using condoms. Declines BC for now. Last Pap: 09/11/17  Results were: no abnormalities /neg HPV DNA  Hx of STDs: HPV  There is no FH of breast cancer. There is no FH of ovarian cancer. The patient does do self-breast exams.  Tobacco use: The patient denies current or previous tobacco use. Alcohol use: social drinker No drug use Exercise: moderately active  She does get adequate calcium and Vitamin D in her diet.  She has a hx of anxiety/depression/anger issues. Taking zoloft 150 mg currently.   he stopped zoloft awhile back. Starting to have panic attacks again, mind doesn't shut off, and trouble sleeping. Taking melatonin without relief. Thinks she needs to restart sertraline again which worked well in past at 150 mg dose. Did effexor but didn't work as well as zoloft.   Normal fasting labs 7/21.  Past Medical History:  Diagnosis Date  . Anxiety and depression   . Asthma   . Cervical high risk human papillomavirus (HPV) DNA test positive 2012  . Depression   . DUB (dysfunctional uterine bleeding)     Past Surgical History:  Procedure Laterality Date  . COLPOSCOPY  2012    Family History  Problem Relation Age of Onset  . Diabetes type II Father   . Schizophrenia Maternal Grandmother   . Hypertension Mother   . Breast cancer Other      Social History   Socioeconomic History  . Marital status: Single    Spouse name: Not on file  . Number of children: Not on file  . Years of education: Not on file  . Highest education level: Not on file  Occupational History  . Not on file  Tobacco Use  . Smoking status: Never Smoker  . Smokeless tobacco: Never Used  Vaping Use  . Vaping Use: Never used  Substance and Sexual Activity  . Alcohol use: Yes  . Drug use: No  . Sexual activity: Yes    Birth control/protection: None  Other Topics Concern  . Not on file  Social History Narrative  . Not on file   Social Determinants of Health   Financial Resource Strain: Not on file  Food Insecurity: Not on file  Transportation Needs: Not on file  Physical Activity: Not on file  Stress: Not on file  Social Connections: Not on file  Intimate Partner Violence: Not on file    Current Outpatient Medications on File Prior to Visit  Medication Sig Dispense Refill  . clotrimazole-betamethasone (LOTRISONE) cream Apply externally BID prn sx up to 2 wks 45 g 0  . sertraline (ZOLOFT) 100 MG tablet Take 1.5 tablets (150 mg total) by mouth daily. 135 tablet 1  . traZODone (DESYREL) 50 MG tablet Take 1 tablet (50 mg total) by mouth at bedtime. 30 tablet 1   No  current facility-administered medications on file prior to visit.    ROS:  Review of Systems  Constitutional: Positive for fatigue. Negative for fever and unexpected weight change.  Respiratory: Negative for cough, shortness of breath and wheezing.   Cardiovascular: Negative for chest pain, palpitations and leg swelling.  Gastrointestinal: Negative for blood in stool, constipation, diarrhea, nausea and vomiting.  Endocrine: Negative for cold intolerance, heat intolerance and polyuria.  Genitourinary: Positive for vaginal discharge. Negative for dyspareunia, dysuria, flank pain, frequency, genital sores, hematuria, menstrual problem, pelvic pain, urgency, vaginal bleeding and  vaginal pain.  Musculoskeletal: Negative for back pain, joint swelling and myalgias.  Skin: Negative for rash.  Neurological: Positive for headaches. Negative for dizziness, syncope, light-headedness and numbness.  Hematological: Negative for adenopathy.  Psychiatric/Behavioral: Positive for agitation, dysphoric mood and sleep disturbance. Negative for confusion and suicidal ideas. The patient is not nervous/anxious.      Objective: There were no vitals taken for this visit.   Physical Exam Constitutional:      Appearance: She is well-developed.  Genitourinary:     Vulva normal.     No vaginal discharge, erythema or tenderness.      Right Adnexa: not tender and no mass present.    Left Adnexa: not tender and no mass present.    No cervical motion tenderness or polyp.     Uterus is not enlarged or tender.  Breasts:     Right: No mass, nipple discharge, skin change or tenderness.     Left: No mass, nipple discharge, skin change or tenderness.    Neck:     Thyroid: No thyromegaly.  Cardiovascular:     Rate and Rhythm: Normal rate and regular rhythm.     Heart sounds: Normal heart sounds. No murmur heard.   Pulmonary:     Effort: Pulmonary effort is normal.     Breath sounds: Normal breath sounds.  Abdominal:     Palpations: Abdomen is soft.     Tenderness: There is no abdominal tenderness. There is no guarding.  Musculoskeletal:        General: Normal range of motion.     Cervical back: Normal range of motion.  Neurological:     General: No focal deficit present.     Mental Status: She is alert and oriented to person, place, and time.     Cranial Nerves: No cranial nerve deficit.  Skin:    General: Skin is warm and dry.  Psychiatric:        Mood and Affect: Mood normal.        Behavior: Behavior normal.        Thought Content: Thought content normal.        Judgment: Judgment normal.  Vitals reviewed.     Assessment/Plan: Encounter for annual routine  gynecological examination  Screening for STD (sexually transmitted disease) - Plan: [facility] STD  Anxiety and depression - Restart zoloft. Rx eRxd. F/u via phone after being on meds for 2 months with sx f/u. Thinks she may only need 100 mg dose for now. Will re-eval then  - Plan: sertraline (ZOLOFT) 50 MG tablet  Acute vaginitis - Plan: clotrimazole-betamethasone (LOTRISONE) cream; In vulvar area, looks fungal. Rx lotrisone crm. Keep area dry/try desitin/A&D. F/u prn.   Blood tests for routine general physical examination - Plan: Comprehensive metabolic panel, Lipid panel, Hemoglobin A1c  Screening cholesterol level - Plan: Lipid panel  Screening for diabetes mellitus - Plan: Hemoglobin A1c   No orders of the  defined types were placed in this encounter.            GYN counsel adequate intake of calcium and vitamin D, diet and exercise     F/U  No follow-ups on file.  Rebecca Ruffner B. Shamarie Call, PA-C 11/03/2020 4:05 PM

## 2020-11-08 ENCOUNTER — Ambulatory Visit: Payer: Self-pay | Admitting: Obstetrics and Gynecology

## 2020-11-08 DIAGNOSIS — F419 Anxiety disorder, unspecified: Secondary | ICD-10-CM

## 2020-11-08 DIAGNOSIS — Z01419 Encounter for gynecological examination (general) (routine) without abnormal findings: Secondary | ICD-10-CM

## 2020-12-01 ENCOUNTER — Ambulatory Visit: Payer: Self-pay | Admitting: Obstetrics and Gynecology

## 2020-12-29 NOTE — Progress Notes (Deleted)
No chief complaint on file.    HPI:      [name] is a 40 y.o. G0P0000 who LMP was No LMP recorded., presents today for her annual examination. Her menses are monthly now off depo, lasting 3-5 days, with dime sized clots, med flow.  Dysmenorrhea mod to severe (for pt), improved with NSAIDs. She does not have intermenstrual bleeding.  Complains of severe vaginal itching before menses and then a few days afterwards. Wears pads and sx occur if flow is heavier that cycle. Has vaginal d/c, too. Treats with OTC monistat with temporary relief. Using unscented soaps, pads, no dryer sheets.   Sex activity: occas sexually active, using condoms. Declines BC for now. Last Pap: 09/11/17  Results were: no abnormalities /neg HPV DNA  Hx of STDs: HPV  Mammogram:  There is no FH of breast cancer. There is no FH of ovarian cancer. The patient does do self-breast exams.   Tobacco use: The patient denies current or previous tobacco use. Alcohol use: social drinker No drug use Exercise: moderately active   She does get adequate calcium and Vitamin D in her diet.   She has a hx of anxiety/depression/anger issues. She stopped zoloft awhile back. Starting to have panic attacks again, mind doesn't shut off, and trouble sleeping. Taking melatonin without relief. Thinks she needs to restart sertraline again which worked well in past at 150 mg dose. Did effexor but didn't work as well as zoloft. On 150 mg diose daily  Normal labs 7/21  Past Medical History:  Diagnosis Date   Anxiety and depression    Asthma    Cervical high risk human papillomavirus (HPV) DNA test positive 2012   Depression    DUB (dysfunctional uterine bleeding)     Past Surgical History:  Procedure Laterality Date   COLPOSCOPY  2012    Family History  Problem Relation Age of Onset   Diabetes type II Father    Schizophrenia Maternal Grandmother    Hypertension Mother    Breast cancer Other     Social History    Socioeconomic History   Marital status: Single    Spouse name: Not on file   Number of children: Not on file   Years of education: Not on file   Highest education level: Not on file  Occupational History   Not on file  Tobacco Use   Smoking status: Never   Smokeless tobacco: Never  Vaping Use   Vaping Use: Never used  Substance and Sexual Activity   Alcohol use: Yes   Drug use: No   Sexual activity: Yes    Birth control/protection: None  Other Topics Concern   Not on file  Social History Narrative   Not on file   Social Determinants of Health   Financial Resource Strain: Not on file  Food Insecurity: Not on file  Transportation Needs: Not on file  Physical Activity: Not on file  Stress: Not on file  Social Connections: Not on file  Intimate Partner Violence: Not on file    Current Outpatient Medications on File Prior to Visit  Medication Sig Dispense Refill   clotrimazole-betamethasone (LOTRISONE) cream Apply externally BID prn sx up to 2 wks 45 g 0   sertraline (ZOLOFT) 100 MG tablet Take 1.5 tablets (150 mg total) by mouth daily. 135 tablet 1   traZODone (DESYREL) 50 MG tablet Take 1 tablet (50 mg total) by mouth at bedtime. 30 tablet 1   No  current facility-administered medications on file prior to visit.    ROS:  Review of Systems  Constitutional:  Positive for fatigue. Negative for fever and unexpected weight change.  Respiratory:  Negative for cough, shortness of breath and wheezing.   Cardiovascular:  Negative for chest pain, palpitations and leg swelling.  Gastrointestinal:  Negative for blood in stool, constipation, diarrhea, nausea and vomiting.  Endocrine: Negative for cold intolerance, heat intolerance and polyuria.  Genitourinary:  Positive for vaginal discharge. Negative for dyspareunia, dysuria, flank pain, frequency, genital sores, hematuria, menstrual problem, pelvic pain, urgency, vaginal bleeding and vaginal pain.  Musculoskeletal:  Negative  for back pain, joint swelling and myalgias.  Skin:  Negative for rash.  Neurological:  Positive for headaches. Negative for dizziness, syncope, light-headedness and numbness.  Hematological:  Negative for adenopathy.  Psychiatric/Behavioral:  Positive for agitation, dysphoric mood and sleep disturbance. Negative for confusion and suicidal ideas. The patient is not nervous/anxious.     Objective: There were no vitals taken for this visit.   Physical Exam Constitutional:      Appearance: She is well-developed.  Genitourinary:     Vulva normal.     No vaginal discharge, erythema or tenderness.      Right Adnexa: not tender and no mass present.    Left Adnexa: not tender and no mass present.    No cervical motion tenderness or polyp.     Uterus is not enlarged or tender.  Breasts:    Right: No mass, nipple discharge, skin change or tenderness.     Left: No mass, nipple discharge, skin change or tenderness.  Neck:     Thyroid: No thyromegaly.  Cardiovascular:     Rate and Rhythm: Normal rate and regular rhythm.     Heart sounds: Normal heart sounds. No murmur heard. Pulmonary:     Effort: Pulmonary effort is normal.     Breath sounds: Normal breath sounds.  Abdominal:     Palpations: Abdomen is soft.     Tenderness: There is no abdominal tenderness. There is no guarding.  Musculoskeletal:        General: Normal range of motion.     Cervical back: Normal range of motion.  Neurological:     General: No focal deficit present.     Mental Status: She is alert and oriented to person, place, and time.     Cranial Nerves: No cranial nerve deficit.  Skin:    General: Skin is warm and dry.  Psychiatric:        Mood and Affect: Mood normal.        Behavior: Behavior normal.        Thought Content: Thought content normal.        Judgment: Judgment normal.  Vitals reviewed.    Assessment/Plan: Encounter for annual routine gynecological examination  Screening for STD (sexually  transmitted disease) - Plan: [facility] STD  Anxiety and depression - Restart zoloft. Rx eRxd. F/u via phone after being on meds for 2 months with sx f/u. Thinks she may only need 100 mg dose for now. Will re-eval then  - Plan: sertraline (ZOLOFT) 50 MG tablet  Acute vaginitis - Plan: clotrimazole-betamethasone (LOTRISONE) cream; In vulvar area, looks fungal. Rx lotrisone crm. Keep area dry/try desitin/A&D. F/u prn.   Blood tests for routine general physical examination - Plan: Comprehensive metabolic panel, Lipid panel, Hemoglobin A1c  Screening cholesterol level - Plan: Lipid panel  Screening for diabetes mellitus - Plan: Hemoglobin A1c   No  orders of the defined types were placed in this encounter.            GYN counsel adequate intake of calcium and vitamin D, diet and exercise     F/U  No follow-ups on file.  Tarance Balan B. Almir Botts, PA-C 12/29/2020 1:53 PM

## 2021-01-02 ENCOUNTER — Ambulatory Visit: Payer: Self-pay | Admitting: Obstetrics and Gynecology

## 2021-01-02 DIAGNOSIS — Z01419 Encounter for gynecological examination (general) (routine) without abnormal findings: Secondary | ICD-10-CM

## 2021-01-02 DIAGNOSIS — Z1231 Encounter for screening mammogram for malignant neoplasm of breast: Secondary | ICD-10-CM

## 2021-01-02 DIAGNOSIS — F32A Depression, unspecified: Secondary | ICD-10-CM

## 2022-07-19 ENCOUNTER — Ambulatory Visit: Payer: 59 | Admitting: Podiatry

## 2022-07-20 ENCOUNTER — Ambulatory Visit (INDEPENDENT_AMBULATORY_CARE_PROVIDER_SITE_OTHER): Payer: 59

## 2022-07-20 ENCOUNTER — Ambulatory Visit (INDEPENDENT_AMBULATORY_CARE_PROVIDER_SITE_OTHER): Payer: 59 | Admitting: Podiatry

## 2022-07-20 ENCOUNTER — Encounter: Payer: Self-pay | Admitting: Podiatry

## 2022-07-20 DIAGNOSIS — M7752 Other enthesopathy of left foot: Secondary | ICD-10-CM

## 2022-07-20 DIAGNOSIS — M76822 Posterior tibial tendinitis, left leg: Secondary | ICD-10-CM | POA: Diagnosis not present

## 2022-07-20 DIAGNOSIS — M7750 Other enthesopathy of unspecified foot: Secondary | ICD-10-CM

## 2022-07-20 DIAGNOSIS — F419 Anxiety disorder, unspecified: Secondary | ICD-10-CM | POA: Insufficient documentation

## 2022-07-20 DIAGNOSIS — F32A Depression, unspecified: Secondary | ICD-10-CM | POA: Insufficient documentation

## 2022-07-20 DIAGNOSIS — J45909 Unspecified asthma, uncomplicated: Secondary | ICD-10-CM | POA: Insufficient documentation

## 2022-07-20 MED ORDER — TRIAMCINOLONE ACETONIDE 10 MG/ML IJ SUSP
10.0000 mg | Freq: Once | INTRAMUSCULAR | Status: AC
  Administered 2022-07-20: 10 mg

## 2022-07-20 MED ORDER — DICLOFENAC SODIUM 75 MG PO TBEC: 75.0000 mg | DELAYED_RELEASE_TABLET | Freq: Two times a day (BID) | ORAL | 2 refills | Status: AC

## 2022-07-20 NOTE — Progress Notes (Signed)
Subjective:   Patient ID: Rayvon Char, female   DOB: 42 y.o.   MRN: [id number]   HPI Patient presents stating she has been having a lot of pain in her left ankle and heel but the ankle has been worse recently.  States that this has been going on for several months does not remember an injury does not smoke tries to be active   Review of Systems  All other systems reviewed and are negative.       Objective:  Physical Exam Vitals and nursing note reviewed.  Constitutional:      Appearance: She is well-developed.  Pulmonary:     Effort: Pulmonary effort is normal.  Musculoskeletal:        General: Normal range of motion.  Skin:    General: Skin is warm.  Neurological:     Mental Status: She is alert.     Neurovascular status intact muscle strength adequate range of motion within normal limits with patient found to have inflammation pain of the inside of the left ankle with fluid buildup and moderate depression of the arch.  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  I recommended injection I explained procedure explained risk of doing this and she wants to do this and I did sterile prep injected the sheath of the posterior tib tendon 3 mg Dexasone Kenalog 5 mg Xylocaine dispensed fascial brace to lift up the arch gave instructions for good shoe gear usage and reappoint for [place] to recheck again in the next few weeks may require orthotics and may require MRI if symptoms persist  X-rays indicate that there is no signs of significant bone pathology appears to be soft tissue with moderate depression of the arch

## 2022-08-20 ENCOUNTER — Ambulatory Visit: Payer: 59 | Admitting: Podiatry

## 2023-08-27 ENCOUNTER — Telehealth: Admitting: Physician Assistant

## 2023-08-27 DIAGNOSIS — B9689 Other specified bacterial agents as the cause of diseases classified elsewhere: Secondary | ICD-10-CM

## 2023-08-27 DIAGNOSIS — J019 Acute sinusitis, unspecified: Secondary | ICD-10-CM

## 2023-08-27 DIAGNOSIS — J4531 Mild persistent asthma with (acute) exacerbation: Secondary | ICD-10-CM

## 2023-08-27 MED ORDER — ALBUTEROL SULFATE HFA 108 (90 BASE) MCG/ACT IN AERS: 1.0000 | INHALATION_SPRAY | Freq: Four times a day (QID) | RESPIRATORY_TRACT | 0 refills | Status: DC | PRN

## 2023-08-27 MED ORDER — AMOXICILLIN-POT CLAVULANATE 875-125 MG PO TABS: 1.0000 | ORAL_TABLET | Freq: Two times a day (BID) | ORAL | 0 refills | Status: AC

## 2023-08-27 MED ORDER — PSEUDOEPH-BROMPHEN-DM 30-2-10 MG/5ML PO SYRP: 5.0000 mL | ORAL_SOLUTION | Freq: Four times a day (QID) | ORAL | 0 refills | Status: DC | PRN

## 2023-08-27 MED ORDER — PREDNISONE 20 MG PO TABS: 40.0000 mg | ORAL_TABLET | Freq: Every day | ORAL | 0 refills | Status: DC

## 2023-08-27 NOTE — Progress Notes (Signed)
 Virtual Visit Consent   [name], you are scheduled for a virtual visit with a [facility] provider today. Just as with appointments in the office, your consent must be obtained to participate. Your consent will be active for this visit and any virtual visit you may have with one of our providers in the next 365 days. If you have a MyChart account, a copy of this consent can be sent to you electronically.  As this is a virtual visit, video technology does not allow for your provider to perform a traditional examination. This may limit your provider's ability to fully assess your condition. If your provider identifies any concerns that need to be evaluated in person or the need to arrange testing (such as labs, EKG, etc.), we will make arrangements to do so. Although advances in technology are sophisticated, we cannot ensure that it will always work on either your end or our end. If the connection with a video visit is poor, the visit may have to be switched to a telephone visit. With either a video or telephone visit, we are not always able to ensure that we have a secure connection.  By engaging in this virtual visit, you consent to the provision of healthcare and authorize for your insurance to be billed (if applicable) for the services provided during this visit. Depending on your insurance coverage, you may receive a charge related to this service.  I need to obtain your verbal consent now. Are you willing to proceed with your visit today? Rebecca Shah has provided verbal consent on 08/27/2023 for a virtual visit (video or telephone). Margaretann Loveless, PA-C  Date: 08/27/2023 3:27 PM   Virtual Visit via Video Note   I, Margaretann Loveless, connected with  Rebecca Shah  ([id number], 08-07-1980) on 08/27/23 at  3:15 PM EDT by a video-enabled telemedicine application and verified that I am speaking with the correct person using two identifiers.  Location: Patient: Virtual Visit  Location Patient: Home Provider: Virtual Visit Location Provider: Home Office   I discussed the limitations of evaluation and management by telemedicine and the availability of in person appointments. The patient expressed understanding and agreed to proceed.    History of Present Illness: Rebecca Shah is a 43 y.o. who identifies as a female who was assigned female at birth, and is being seen today for sinus congestion and cough.  HPI: URI  This is a new problem. The current episode started 1 to 4 weeks ago. The problem has been gradually worsening. There has been no fever. Associated symptoms include congestion, coughing, ear pain (for a day, improved with OTC ear drops), headaches, nausea (had mild nausea for 24 hours, now improved over last 48 hours; occurred with a lot of mucus drainage), a plugged ear sensation, rhinorrhea (and post nasal drainage), sinus pain, sneezing, a sore throat (scratchy not sore, improved) and wheezing (started yesterday). Pertinent negatives include no chest pain, diarrhea or vomiting. Associated symptoms comments: Hoarse voice. Treatments tried: sudafed, mucinex, saline nasal spray, flonase, loratadine. The treatment provided no relief.  Reports her roommates infant (62 month old)had pink eye and croupy cough, undiagnosed   Problems:  Patient Active Problem List   Diagnosis Date Noted   Anxiety 07/20/2022   Asthma 07/20/2022   Depressive disorder 07/20/2022    Allergies:  Allergies  Allergen Reactions   Coconut Flavoring Agent (Non-Screening) Anaphylaxis    Coconut Pieces   Medications:  Current Outpatient Medications:  albuterol (VENTOLIN HFA) 108 (90 Base) MCG/ACT inhaler, Inhale 1-2 puffs into the lungs every 6 (six) hours as needed., Disp: 8 g, Rfl: 0   amoxicillin-clavulanate (AUGMENTIN) 875-125 MG tablet, Take 1 tablet by mouth 2 (two) times daily., Disp: 14 tablet, Rfl: 0   brompheniramine-pseudoephedrine-DM 30-2-10 MG/5ML syrup, Take 5 mLs by  mouth 4 (four) times daily as needed., Disp: 120 mL, Rfl: 0   predniSONE (DELTASONE) 20 MG tablet, Take 2 tablets (40 mg total) by mouth daily with breakfast., Disp: 10 tablet, Rfl: 0   ALPRAZolam (XANAX) 0.25 MG tablet, Take 1 tablet by mouth 2 (two) times daily., Disp: , Rfl:    clotrimazole-betamethasone (LOTRISONE) cream, Apply externally BID prn sx up to 2 wks, Disp: 45 g, Rfl: 0   diclofenac (VOLTAREN) 75 MG EC tablet, Take 1 tablet (75 mg total) by mouth 2 (two) times daily., Disp: 50 tablet, Rfl: 2   sertraline (ZOLOFT) 100 MG tablet, Take 1.5 tablets (150 mg total) by mouth daily., Disp: 135 tablet, Rfl: 1   traZODone (DESYREL) 50 MG tablet, Take 1 tablet (50 mg total) by mouth at bedtime., Disp: 30 tablet, Rfl: 1  Observations/Objective: Patient is well-developed, well-nourished in no acute distress.  Resting comfortably at home.  Head is normocephalic, atraumatic.  No labored breathing.  Speech is clear and coherent with logical content.  Patient is alert and oriented at baseline.    Assessment and Plan: 1. Acute bacterial sinusitis (Primary) - amoxicillin-clavulanate (AUGMENTIN) 875-125 MG tablet; Take 1 tablet by mouth 2 (two) times daily.  Dispense: 14 tablet; Refill: 0 - brompheniramine-pseudoephedrine-DM 30-2-10 MG/5ML syrup; Take 5 mLs by mouth 4 (four) times daily as needed.  Dispense: 120 mL; Refill: 0  2. Mild persistent reactive airway disease with acute exacerbation - albuterol (VENTOLIN HFA) 108 (90 Base) MCG/ACT inhaler; Inhale 1-2 puffs into the lungs every 6 (six) hours as needed.  Dispense: 8 g; Refill: 0 - brompheniramine-pseudoephedrine-DM 30-2-10 MG/5ML syrup; Take 5 mLs by mouth 4 (four) times daily as needed.  Dispense: 120 mL; Refill: 0 - predniSONE (DELTASONE) 20 MG tablet; Take 2 tablets (40 mg total) by mouth daily with breakfast.  Dispense: 10 tablet; Refill: 0  - Worsening symptoms that have not responded to OTC medications.  - Will give Augmentin and  Prednisone - Albuterol for wheezing, chest tightness, and shortness of breath - Bromfed DM for cough - Continue allergy medications.  - Steam and humidifier can help - Stay well hydrated and get plenty of rest.  - Seek in person evaluation if no symptom improvement or if symptoms worsen   Follow Up Instructions: I discussed the assessment and treatment plan with the patient. The patient was provided an opportunity to ask questions and all were answered. The patient agreed with the plan and demonstrated an understanding of the instructions.  A copy of instructions were sent to the patient via MyChart unless otherwise noted below.    The patient was advised to call back or seek an in-person evaluation if the symptoms worsen or if the condition fails to improve as anticipated.    Margaretann Loveless, PA-C

## 2023-08-27 NOTE — Patient Instructions (Signed)
 Rebecca Shah, thank you for joining Rebecca Loveless, PA-C for today's virtual visit.  While this provider is not your primary care provider (PCP), if your PCP is located in our provider database this encounter information will be shared with them immediately following your visit.   A [facility] MyChart account gives you access to today's visit and all your visits, tests, and labs performed at [facility] " click here if you don't have a Rebecca Shah MyChart account or go to [email or web address]  Consent: (Patient) Rebecca Shah provided verbal consent for this virtual visit at the beginning of the encounter.  Current Medications:  Current Outpatient Medications:    albuterol (VENTOLIN HFA) 108 (90 Base) MCG/ACT inhaler, Inhale 1-2 puffs into the lungs every 6 (six) hours as needed., Disp: 8 g, Rfl: 0   amoxicillin-clavulanate (AUGMENTIN) 875-125 MG tablet, Take 1 tablet by mouth 2 (two) times daily., Disp: 14 tablet, Rfl: 0   brompheniramine-pseudoephedrine-DM 30-2-10 MG/5ML syrup, Take 5 mLs by mouth 4 (four) times daily as needed., Disp: 120 mL, Rfl: 0   predniSONE (DELTASONE) 20 MG tablet, Take 2 tablets (40 mg total) by mouth daily with breakfast., Disp: 10 tablet, Rfl: 0   ALPRAZolam (XANAX) 0.25 MG tablet, Take 1 tablet by mouth 2 (two) times daily., Disp: , Rfl:    clotrimazole-betamethasone (LOTRISONE) cream, Apply externally BID prn sx up to 2 wks, Disp: 45 g, Rfl: 0   diclofenac (VOLTAREN) 75 MG EC tablet, Take 1 tablet (75 mg total) by mouth 2 (two) times daily., Disp: 50 tablet, Rfl: 2   sertraline (ZOLOFT) 100 MG tablet, Take 1.5 tablets (150 mg total) by mouth daily., Disp: 135 tablet, Rfl: 1   traZODone (DESYREL) 50 MG tablet, Take 1 tablet (50 mg total) by mouth at bedtime., Disp: 30 tablet, Rfl: 1   Medications ordered in this encounter:  Meds ordered this encounter  Medications   albuterol (VENTOLIN HFA) 108 (90 Base) MCG/ACT inhaler    Sig:  Inhale 1-2 puffs into the lungs every 6 (six) hours as needed.    Dispense:  8 g    Refill:  0    Supervising Provider:   Merrilee Jansky [[id number]]   amoxicillin-clavulanate (AUGMENTIN) 875-125 MG tablet    Sig: Take 1 tablet by mouth 2 (two) times daily.    Dispense:  14 tablet    Refill:  0    Supervising Provider:   Merrilee Jansky [[id number]]   brompheniramine-pseudoephedrine-DM 30-2-10 MG/5ML syrup    Sig: Take 5 mLs by mouth 4 (four) times daily as needed.    Dispense:  120 mL    Refill:  0    Supervising Provider:   Merrilee Jansky [[id number]]   predniSONE (DELTASONE) 20 MG tablet    Sig: Take 2 tablets (40 mg total) by mouth daily with breakfast.    Dispense:  10 tablet    Refill:  0    Supervising Provider:   Merrilee Jansky [[id number]]     *If you need refills on other medications prior to your next appointment, please contact your pharmacy*  Follow-Up: Call back or seek an in-person evaluation if the symptoms worsen or if the condition fails to improve as anticipated.  [facility] [phone]  Other Instructions Sinus Infection, Adult A sinus infection, also called sinusitis, is inflammation of your sinuses. Sinuses are hollow spaces in the bones around your face. Your sinuses are located: Around your eyes. In the middle  of your forehead. Behind your nose. In your cheekbones. Mucus normally drains out of your sinuses. When your nasal tissues become inflamed or swollen, mucus can become trapped or blocked. This allows bacteria, viruses, and fungi to grow, which leads to infection. Most infections of the sinuses are caused by a virus. A sinus infection can develop quickly. It can last for up to 4 weeks (acute) or for more than 12 weeks (chronic). A sinus infection often develops after a cold. What are the causes? This condition is caused by anything that creates swelling in the sinuses or stops mucus from draining. This  includes: Allergies. Asthma. Infection from bacteria or viruses. Deformities or blockages in your nose or sinuses. Abnormal growths in the nose (nasal polyps). Pollutants, such as chemicals or irritants in the air. Infection from fungi. This is rare. What increases the risk? You are more likely to develop this condition if you: Have a weak body defense system (immune system). Do a lot of swimming or diving. Overuse nasal sprays. Smoke. What are the signs or symptoms? The main symptoms of this condition are pain and a feeling of pressure around the affected sinuses. Other symptoms include: Stuffy nose or congestion that makes it difficult to breathe through your nose. Thick yellow or greenish drainage from your nose. Tenderness, swelling, and warmth over the affected sinuses. A cough that may get worse at night. Decreased sense of smell and taste. Extra mucus that collects in the throat or the back of the nose (postnasal drip) causing a sore throat or bad breath. Tiredness (fatigue). Fever. How is this diagnosed? This condition is diagnosed based on: Your symptoms. Your medical history. A physical exam. Tests to find out if your condition is acute or chronic. This may include: Checking your nose for nasal polyps. Viewing your sinuses using a device that has a light (endoscope). Testing for allergies or bacteria. Imaging tests, such as an MRI or CT scan. In rare cases, a bone biopsy may be done to rule out more serious types of fungal sinus disease. How is this treated? Treatment for a sinus infection depends on the cause and whether your condition is chronic or acute. If caused by a virus, your symptoms should go away on their own within 10 days. You may be given medicines to relieve symptoms. They include: Medicines that shrink swollen nasal passages (decongestants). A spray that eases inflammation of the nostrils (topical intranasal corticosteroids). Rinses that help get rid  of thick mucus in your nose (nasal saline washes). Medicines that treat allergies (antihistamines). Over-the-counter pain relievers. If caused by bacteria, your health care provider may recommend waiting to see if your symptoms improve. Most bacterial infections will get better without antibiotic medicine. You may be given antibiotics if you have: A severe infection. A weak immune system. If caused by narrow nasal passages or nasal polyps, surgery may be needed. Follow these instructions at home: Medicines Take, use, or apply over-the-counter and prescription medicines only as told by your health care provider. These may include nasal sprays. If you were prescribed an antibiotic medicine, take it as told by your health care provider. Do not stop taking the antibiotic even if you start to feel better. Hydrate and humidify  Drink enough fluid to keep your urine pale yellow. Staying hydrated will help to thin your mucus. Use a cool mist humidifier to keep the humidity level in your home above 50%. Inhale steam for 10-15 minutes, 3-4 times a day, or as told by your  health care provider. You can do this in the bathroom while a hot shower is running. Limit your exposure to cool or dry air. Rest Rest as much as possible. Sleep with your head raised (elevated). Make sure you get enough sleep each night. General instructions  Apply a warm, moist washcloth to your face 3-4 times a day or as told by your health care provider. This will help with discomfort. Use nasal saline washes as often as told by your health care provider. Wash your hands often with soap and water to reduce your exposure to germs. If soap and water are not available, use hand sanitizer. Do not smoke. Avoid being around people who are smoking (secondhand smoke). Keep all follow-up visits. This is important. Contact a health care provider if: You have a fever. Your symptoms get worse. Your symptoms do not improve within 10  days. Get help right away if: You have a severe headache. You have persistent vomiting. You have severe pain or swelling around your face or eyes. You have vision problems. You develop confusion. Your neck is stiff. You have trouble breathing. These symptoms may be an emergency. Get help right away. Call 911. Do not wait to see if the symptoms will go away. Do not drive yourself to the hospital. Summary A sinus infection is soreness and inflammation of your sinuses. Sinuses are hollow spaces in the bones around your face. This condition is caused by nasal tissues that become inflamed or swollen. The swelling traps or blocks the flow of mucus. This allows bacteria, viruses, and fungi to grow, which leads to infection. If you were prescribed an antibiotic medicine, take it as told by your health care provider. Do not stop taking the antibiotic even if you start to feel better. Keep all follow-up visits. This is important. This information is not intended to replace advice given to you by your health care provider. Make sure you discuss any questions you have with your health care provider. Document Revised: 04/25/2021 Document Reviewed: 04/25/2021 Elsevier Patient Education  2024 Elsevier Inc.   If you have been instructed to have an in-person evaluation today at a local Urgent Care facility, please use the link below. It will take you to a list of all of our available [facility], including address, phone number and hours of operation. Please do not delay care.  [facility]  If you or a family member do not have a primary care provider, use the link below to schedule a visit and establish care. When you choose a [facility] primary care physician or advanced practice provider, you gain a long-term partner in health. Find a Primary Care Provider  Learn more about [facility]'s in-office and virtual care options: [facility] - Get Care Now

## 2023-09-13 ENCOUNTER — Telehealth: Payer: Self-pay | Admitting: Physician Assistant

## 2023-09-13 DIAGNOSIS — B9689 Other specified bacterial agents as the cause of diseases classified elsewhere: Secondary | ICD-10-CM

## 2023-09-13 DIAGNOSIS — J208 Acute bronchitis due to other specified organisms: Secondary | ICD-10-CM

## 2023-09-13 MED ORDER — AZITHROMYCIN 250 MG PO TABS: ORAL_TABLET | ORAL | 0 refills | Status: AC

## 2023-09-13 NOTE — Progress Notes (Signed)
 Virtual Visit Consent   Rebecca Shah, you are scheduled for a virtual visit with a [facility] provider today. Just as with appointments in the office, your consent must be obtained to participate. Your consent will be active for this visit and any virtual visit you may have with one of our providers in the next 365 days. If you have a MyChart account, a copy of this consent can be sent to you electronically.  As this is a virtual visit, video technology does not allow for your provider to perform a traditional examination. This may limit your provider's ability to fully assess your condition. If your provider identifies any concerns that need to be evaluated in person or the need to arrange testing (such as labs, EKG, etc.), we will make arrangements to do so. Although advances in technology are sophisticated, we cannot ensure that it will always work on either your end or our end. If the connection with a video visit is poor, the visit may have to be switched to a telephone visit. With either a video or telephone visit, we are not always able to ensure that we have a secure connection.  By engaging in this virtual visit, you consent to the provision of healthcare and authorize for your insurance to be billed (if applicable) for the services provided during this visit. Depending on your insurance coverage, you may receive a charge related to this service.  I need to obtain your verbal consent now. Are you willing to proceed with your visit today? Rebecca Shah has provided verbal consent on 09/13/2023 for a virtual visit (video or telephone). Margaretann Loveless, PA-C  Date: 09/13/2023 9:22 AM   Virtual Visit via Video Note   I, Margaretann Loveless, connected with  Rebecca Shah  ([id number], 14-Oct-1980) on 09/13/23 at  9:15 AM EDT by a video-enabled telemedicine application and verified that I am speaking with the correct person using two identifiers.  Location: Patient: Virtual Visit  Location Patient: Mobile Provider: Virtual Visit Location Provider: Home Office   I discussed the limitations of evaluation and management by telemedicine and the availability of in person appointments. The patient expressed understanding and agreed to proceed.    History of Present Illness: Rebecca Shah is a 43 y.o. who identifies as a female who was assigned female at birth, and is being seen today for cough and congestion.  HPI: Cough This is a recurrent problem. The current episode started 1 to 4 weeks ago (Seen virtually 08/27/23 for URI and treated with Augmentin; Seen in person at [facility] on 09/10/23 for N/V/D given Imodium and Zofran; Now presenting with worsening cough). The problem has been gradually worsening. The problem occurs constantly. The cough is Productive of sputum and productive of purulent sputum (mild; reports cough is raspy). Associated symptoms include nasal congestion, postnasal drip, a sore throat (scratchy) and shortness of breath (intermittent). Pertinent negatives include no chills, ear pain, fever, headaches or rhinorrhea. The symptoms are aggravated by lying down and pollens. She has tried a beta-agonist inhaler, oral steroids, prescription cough suppressant and rest (allegra, Augmentin, Prednisone 40mg  x 5 days, Bromfed DM, Albuterol) for the symptoms. The treatment provided no relief. Her past medical history is significant for asthma, bronchitis and environmental allergies.     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) Anaphylaxis    Coconut Pieces   Medications:  Current Outpatient Medications:    azithromycin (ZITHROMAX) 250 MG tablet, Take 2 tablets on day 1, then 1 tablet daily on days 2 through 5, Disp: 6 tablet, Rfl: 0   albuterol (VENTOLIN HFA) 108 (90 Base)  MCG/ACT inhaler, Inhale 1-2 puffs into the lungs every 6 (six) hours as needed., Disp: 8 g, Rfl: 0   ALPRAZolam (XANAX) 0.25 MG tablet, Take 1 tablet by mouth 2 (two) times daily., Disp: , Rfl:    amoxicillin-clavulanate (AUGMENTIN) 875-125 MG tablet, Take 1 tablet by mouth 2 (two) times daily., Disp: 14 tablet, Rfl: 0   clotrimazole-betamethasone (LOTRISONE) cream, Apply externally BID prn sx up to 2 wks, Disp: 45 g, Rfl: 0   diclofenac (VOLTAREN) 75 MG EC tablet, Take 1 tablet (75 mg total) by mouth 2 (two) times daily., Disp: 50 tablet, Rfl: 2   predniSONE (DELTASONE) 20 MG tablet, Take 2 tablets (40 mg total) by mouth daily with breakfast., Disp: 10 tablet, Rfl: 0   sertraline (ZOLOFT) 100 MG tablet, Take 1.5 tablets (150 mg total) by mouth daily., Disp: 135 tablet, Rfl: 1   traZODone (DESYREL) 50 MG tablet, Take 1 tablet (50 mg total) by mouth at bedtime., Disp: 30 tablet, Rfl: 1  Observations/Objective: Patient is well-developed, well-nourished in no acute distress.  Resting comfortably  Head is normocephalic, atraumatic.  No labored breathing.  Speech is clear and coherent with logical content.  Patient is alert and oriented at baseline.    Assessment and Plan: 1. Acute bacterial bronchitis (Primary) - azithromycin (ZITHROMAX) 250 MG tablet; Take 2 tablets on day 1, then 1 tablet daily on days 2 through 5  Dispense: 6 tablet; Refill: 0  - Worsening over a week despite OTC medications - Will treat with Z-pack  - Can continue Mucinex  - Push fluids.  - Rest.  - Steam and humidifier can help - Seek in person evaluation if worsening or symptoms fail to improve    Follow Up Instructions: I discussed the assessment and treatment plan with the patient. The patient was provided an opportunity to ask questions and all were answered. The patient agreed with the plan and demonstrated an understanding of the instructions.  A copy of instructions were sent to the patient via MyChart unless  otherwise noted below.    The patient was advised to call back or seek an in-person evaluation if the symptoms worsen or if the condition fails to improve as anticipated.    Margaretann Loveless, PA-C

## 2023-09-13 NOTE — Patient Instructions (Signed)
 Rebecca Shah, thank you for joining Margaretann Loveless, PA-C for today's virtual visit.  While this provider is not your primary care provider (PCP), if your PCP is located in our provider database this encounter information will be shared with them immediately following your visit.   A [facility] MyChart account gives you access to today's visit and all your visits, tests, and labs performed at [facility] " click here if you don't have a [facility] MyChart account or go to [email or web address]  Consent: (Patient) Rebecca Shah provided verbal consent for this virtual visit at the beginning of the encounter.  Current Medications:  Current Outpatient Medications:    azithromycin (ZITHROMAX) 250 MG tablet, Take 2 tablets on day 1, then 1 tablet daily on days 2 through 5, Disp: 6 tablet, Rfl: 0   albuterol (VENTOLIN HFA) 108 (90 Base) MCG/ACT inhaler, Inhale 1-2 puffs into the lungs every 6 (six) hours as needed., Disp: 8 g, Rfl: 0   ALPRAZolam (XANAX) 0.25 MG tablet, Take 1 tablet by mouth 2 (two) times daily., Disp: , Rfl:    amoxicillin-clavulanate (AUGMENTIN) 875-125 MG tablet, Take 1 tablet by mouth 2 (two) times daily., Disp: 14 tablet, Rfl: 0   clotrimazole-betamethasone (LOTRISONE) cream, Apply externally BID prn sx up to 2 wks, Disp: 45 g, Rfl: 0   diclofenac (VOLTAREN) 75 MG EC tablet, Take 1 tablet (75 mg total) by mouth 2 (two) times daily., Disp: 50 tablet, Rfl: 2   predniSONE (DELTASONE) 20 MG tablet, Take 2 tablets (40 mg total) by mouth daily with breakfast., Disp: 10 tablet, Rfl: 0   sertraline (ZOLOFT) 100 MG tablet, Take 1.5 tablets (150 mg total) by mouth daily., Disp: 135 tablet, Rfl: 1   traZODone (DESYREL) 50 MG tablet, Take 1 tablet (50 mg total) by mouth at bedtime., Disp: 30 tablet, Rfl: 1   Medications ordered in this encounter:  Meds ordered this encounter  Medications   azithromycin (ZITHROMAX) 250 MG tablet    Sig: Take 2 tablets on day  1, then 1 tablet daily on days 2 through 5    Dispense:  6 tablet    Refill:  0    Supervising Provider:   Merrilee Jansky [phone]     *If you need refills on other medications prior to your next appointment, please contact your pharmacy*  Follow-Up: Call back or seek an in-person evaluation if the symptoms worsen or if the condition fails to improve as anticipated.  [facility] [phone]  Other Instructions Acute Bronchitis, Adult  Acute bronchitis is sudden inflammation of the main airways (bronchi) that come off the windpipe (trachea) in the lungs. The swelling causes the airways to get smaller and make more mucus than normal. This can make it hard to breathe and can cause coughing or noisy breathing (wheezing). Acute bronchitis may last several weeks. The cough may last longer. Allergies, asthma, and exposure to smoke may make the condition worse. What are the causes? This condition can be caused by germs and by substances that irritate the lungs, including: Cold and flu viruses. The most common cause of this condition is the virus that causes the common cold. Bacteria. This is less common. Breathing in substances that irritate the lungs, including: Smoke from cigarettes and other forms of tobacco. Dust and pollen. Fumes from household cleaning products, gases, or burned fuel. Indoor or outdoor air pollution. What increases the risk? The following factors may make you more likely to develop  this condition: A weak body's defense system, also called the immune system. A condition that affects your lungs and breathing, such as asthma. What are the signs or symptoms? Common symptoms of this condition include: Coughing. This may bring up clear, yellow, or green mucus from your lungs (sputum). Wheezing. Runny or stuffy nose. Having too much mucus in your lungs (chest congestion). Shortness of breath. Aches and pains, including sore throat or chest. How is  this diagnosed? This condition is usually diagnosed based on: Your symptoms and medical history. A physical exam. You may also have other tests, including tests to rule out other conditions, such as pneumonia. These tests include: A test of lung function. Test of a mucus sample to look for the presence of bacteria. Tests to check the oxygen level in your blood. Blood tests. Chest X-ray. How is this treated? Most cases of acute bronchitis clear up over time without treatment. Your health care provider may recommend: Drinking more fluids to help thin your mucus so it is easier to cough up. Taking inhaled medicine (inhaler) to improve air flow in and out of your lungs. Using a vaporizer or a humidifier. These are machines that add water to the air to help you breathe better. Taking a medicine that thins mucus and clears congestion (expectorant). Taking a medicine that prevents or stops coughing (cough suppressant). It is not common to take an antibiotic medicine for this condition. Follow these instructions at home:  Take over-the-counter and prescription medicines only as told by your health care provider. Use an inhaler, vaporizer, or humidifier as told by your health care provider. Take two teaspoons (10 mL) of honey at bedtime to lessen coughing at night. Drink enough fluid to keep your urine pale yellow. Do not use any products that contain nicotine or tobacco. These products include cigarettes, chewing tobacco, and vaping devices, such as e-cigarettes. If you need help quitting, ask your health care provider. Get plenty of rest. Return to your normal activities as told by your health care provider. Ask your health care provider what activities are safe for you. Keep all follow-up visits. This is important. How is this prevented? To lower your risk of getting this condition again: Wash your hands often with soap and water for at least 20 seconds. If soap and water are not available,  use hand sanitizer. Avoid contact with people who have cold symptoms. Try not to touch your mouth, nose, or eyes with your hands. Avoid breathing in smoke or chemical fumes. Breathing smoke or chemical fumes will make your condition worse. Get the flu shot every year. Contact a health care provider if: Your symptoms do not improve after 2 weeks. You have trouble coughing up the mucus. Your cough keeps you awake at night. You have a fever. Get help right away if you: Cough up blood. Feel pain in your chest. Have severe shortness of breath. Faint or keep feeling like you are going to faint. Have a severe headache. Have a fever or chills that get worse. These symptoms may represent a serious problem that is an emergency. Do not wait to see if the symptoms will go away. Get medical help right away. Call your local emergency services (911 in the [place] Do not drive yourself to the hospital. Summary Acute bronchitis is inflammation of the main airways (bronchi) that come off the windpipe (trachea) in the lungs. The swelling causes the airways to get smaller and make more mucus than normal. Drinking more fluids can help  thin your mucus so it is easier to cough up. Take over-the-counter and prescription medicines only as told by your health care provider. Do not use any products that contain nicotine or tobacco. These products include cigarettes, chewing tobacco, and vaping devices, such as e-cigarettes. If you need help quitting, ask your health care provider. Contact a health care provider if your symptoms do not improve after 2 weeks. This information is not intended to replace advice given to you by your health care provider. Make sure you discuss any questions you have with your health care provider. Document Revised: 08/31/2021 Document Reviewed: 09/21/2020 Elsevier Patient Education  2024 Elsevier Inc.   If you have been instructed to have an in-person evaluation today at a local Urgent  Care facility, please use the link below. It will take you to a list of all of our available [facility], including address, phone number and hours of operation. Please do not delay care.  [facility]  If you or a family member do not have a primary care provider, use the link below to schedule a visit and establish care. When you choose a [facility] primary care physician or advanced practice provider, you gain a long-term partner in health. Find a Primary Care Provider  Learn more about [facility]'s in-office and virtual care options: [facility] - Get Care Now

## 2023-10-22 ENCOUNTER — Telehealth: Payer: Self-pay | Admitting: Physician Assistant

## 2023-10-22 DIAGNOSIS — J40 Bronchitis, not specified as acute or chronic: Secondary | ICD-10-CM

## 2023-10-22 MED ORDER — ALBUTEROL SULFATE HFA 108 (90 BASE) MCG/ACT IN AERS: 2.0000 | INHALATION_SPRAY | Freq: Four times a day (QID) | RESPIRATORY_TRACT | 0 refills | Status: AC | PRN

## 2023-10-22 MED ORDER — PROMETHAZINE-DM 6.25-15 MG/5ML PO SYRP: 5.0000 mL | ORAL_SOLUTION | Freq: Four times a day (QID) | ORAL | 0 refills | Status: AC | PRN

## 2023-10-22 MED ORDER — PREDNISONE 20 MG PO TABS: 40.0000 mg | ORAL_TABLET | Freq: Every day | ORAL | 0 refills | Status: AC

## 2023-10-22 NOTE — Patient Instructions (Signed)
 Luanne Runner, thank you for joining Char Common Ward, PA-C for today's virtual visit.  While this provider is not your primary care provider (PCP), if your PCP is located in our provider database this encounter information will be shared with them immediately following your visit.   A [facility] MyChart account gives you access to today's visit and all your visits, tests, and labs performed at [facility] " click here if you don't have a [facility] MyChart account or go to [email or web address]  Consent: (Patient) ANILAH HUCK provided verbal consent for this virtual visit at the beginning of the encounter.  Current Medications:  Current Outpatient Medications:    albuterol  (VENTOLIN  HFA) 108 (90 Base) MCG/ACT inhaler, Inhale 2 puffs into the lungs every 6 (six) hours as needed for wheezing or shortness of breath., Disp: 8 g, Rfl: 0   predniSONE  (DELTASONE ) 20 MG tablet, Take 2 tablets (40 mg total) by mouth daily with breakfast for 5 days., Disp: 10 tablet, Rfl: 0   promethazine-dextromethorphan (PROMETHAZINE-DM) 6.25-15 MG/5ML syrup, Take 5 mLs by mouth 4 (four) times daily as needed for cough., Disp: 118 mL, Rfl: 0   ALPRAZolam (XANAX) 0.25 MG tablet, Take 1 tablet by mouth 2 (two) times daily., Disp: , Rfl:    amoxicillin -clavulanate (AUGMENTIN ) 875-125 MG tablet, Take 1 tablet by mouth 2 (two) times daily., Disp: 14 tablet, Rfl: 0   clotrimazole -betamethasone  (LOTRISONE ) cream, Apply externally BID prn sx up to 2 wks, Disp: 45 g, Rfl: 0   diclofenac  (VOLTAREN ) 75 MG EC tablet, Take 1 tablet (75 mg total) by mouth 2 (two) times daily., Disp: 50 tablet, Rfl: 2   sertraline  (ZOLOFT ) 100 MG tablet, Take 1.5 tablets (150 mg total) by mouth daily., Disp: 135 tablet, Rfl: 1   traZODone  (DESYREL ) 50 MG tablet, Take 1 tablet (50 mg total) by mouth at bedtime., Disp: 30 tablet, Rfl: 1   Medications ordered in this encounter:  Meds ordered this encounter  Medications    promethazine-dextromethorphan (PROMETHAZINE-DM) 6.25-15 MG/5ML syrup    Sig: Take 5 mLs by mouth 4 (four) times daily as needed for cough.    Dispense:  118 mL    Refill:  0    Supervising Provider:   Corine Dice [id number]   predniSONE  (DELTASONE ) 20 MG tablet    Sig: Take 2 tablets (40 mg total) by mouth daily with breakfast for 5 days.    Dispense:  10 tablet    Refill:  0    Supervising Provider:   LAMPTEY, PHILIP O [[id number]]   albuterol  (VENTOLIN  HFA) 108 (90 Base) MCG/ACT inhaler    Sig: Inhale 2 puffs into the lungs every 6 (six) hours as needed for wheezing or shortness of breath.    Dispense:  8 g    Refill:  0    Supervising Provider:   Corine Dice [[id number]]     *If you need refills on other medications prior to your next appointment, please contact your pharmacy*  Follow-Up: Call back or seek an in-person evaluation if the symptoms worsen or if the condition fails to improve as anticipated.  [facility] [phone]  Other Instructions Take prednisone  as prescribed.  Can take cough syrup as needed for cough.  Can use albuterol  inhaler as needed for wheezing or shortness of breath. If no improvement or symptoms become worse please follow up for in person evaluation.    If you have been instructed to have an in-person evaluation today  at a local Urgent Care facility, please use the link below. It will take you to a list of all of our available [facility], including address, phone number and hours of operation. Please do not delay care.  [facility]  If you or a family member do not have a primary care provider, use the link below to schedule a visit and establish care. When you choose a [facility] primary care physician or advanced practice provider, you gain a long-term partner in health. Find a Primary Care Provider  Learn more about [facility]'s in-office and virtual care options: [facility] - Get Care Now

## 2023-10-22 NOTE — Progress Notes (Signed)
 Virtual Visit Consent   Rebecca Shah, you are scheduled for a virtual visit with a [facility] provider today. Just as with appointments in the office, your consent must be obtained to participate. Your consent will be active for this visit and any virtual visit you may have with one of our providers in the next 365 days. If you have a MyChart account, a copy of this consent can be sent to you electronically.  As this is a virtual visit, video technology does not allow for your provider to perform a traditional examination. This may limit your provider's ability to fully assess your condition. If your provider identifies any concerns that need to be evaluated in person or the need to arrange testing (such as labs, EKG, etc.), we will make arrangements to do so. Although advances in technology are sophisticated, we cannot ensure that it will always work on either your end or our end. If the connection with a video visit is poor, the visit may have to be switched to a telephone visit. With either a video or telephone visit, we are not always able to ensure that we have a secure connection.  By engaging in this virtual visit, you consent to the provision of healthcare and authorize for your insurance to be billed (if applicable) for the services provided during this visit. Depending on your insurance coverage, you may receive a charge related to this service.  I need to obtain your verbal consent now. Are you willing to proceed with your visit today? Rebecca Shah has provided verbal consent on 10/22/2023 for a virtual visit (video or telephone). Char Common Ward, PA-C  Date: 10/22/2023 7:28 PM   Virtual Visit via Video Note   I, Char Common Ward, connected with  Rebecca Shah  ([id number], 1980-06-15) on 10/22/23 at  7:30 PM EDT by a video-enabled telemedicine application and verified that I am speaking with the correct person using two identifiers.  Location: Patient: Virtual Visit Location Patient:  Home Provider: Virtual Visit Location Provider: Home Office   I discussed the limitations of evaluation and management by telemedicine and the availability of in person appointments. The patient expressed understanding and agreed to proceed.    History of Present Illness: Rebecca Shah is a 43 y.o. who identifies as a female who was assigned female at birth, and is being seen today for cough that started 3-4 days ago.  Denies fever, chills.  Reports mild congestion that started today.  She has a h/o asthma.  Denies wheezing or shortness of breath.  She does have an albuterol  inhaler which has provided minimal relief.    HPI: HPI  Problems:  Patient Active Problem List   Diagnosis Date Noted   Anxiety 07/20/2022   Asthma 07/20/2022   Depressive disorder 07/20/2022    Allergies:  Allergies  Allergen Reactions   Coconut Flavoring Agent (Non-Screening) Anaphylaxis    Coconut Pieces   Medications:  Current Outpatient Medications:    albuterol  (VENTOLIN  HFA) 108 (90 Base) MCG/ACT inhaler, Inhale 2 puffs into the lungs every 6 (six) hours as needed for wheezing or shortness of breath., Disp: 8 g, Rfl: 0   predniSONE  (DELTASONE ) 20 MG tablet, Take 2 tablets (40 mg total) by mouth daily with breakfast for 5 days., Disp: 10 tablet, Rfl: 0   promethazine-dextromethorphan (PROMETHAZINE-DM) 6.25-15 MG/5ML syrup, Take 5 mLs by mouth 4 (four) times daily as needed for cough., Disp: 118 mL, Rfl: 0   ALPRAZolam (XANAX) 0.25 MG  tablet, Take 1 tablet by mouth 2 (two) times daily., Disp: , Rfl:    amoxicillin -clavulanate (AUGMENTIN ) 875-125 MG tablet, Take 1 tablet by mouth 2 (two) times daily., Disp: 14 tablet, Rfl: 0   clotrimazole -betamethasone  (LOTRISONE ) cream, Apply externally BID prn sx up to 2 wks, Disp: 45 g, Rfl: 0   diclofenac  (VOLTAREN ) 75 MG EC tablet, Take 1 tablet (75 mg total) by mouth 2 (two) times daily., Disp: 50 tablet, Rfl: 2   sertraline  (ZOLOFT ) 100 MG tablet, Take 1.5 tablets  (150 mg total) by mouth daily., Disp: 135 tablet, Rfl: 1   traZODone  (DESYREL ) 50 MG tablet, Take 1 tablet (50 mg total) by mouth at bedtime., Disp: 30 tablet, Rfl: 1  Observations/Objective: Patient is well-developed, well-nourished in no acute distress.  Resting comfortably  at home.  Head is normocephalic, atraumatic.  No labored breathing.  Speech is clear and coherent with logical content.  Patient is alert and oriented at baseline.    Assessment and Plan: 1. Bronchitis (Primary)  Will prescribe prednisone  and cough syrup.  Refill of albuterol  inhaler provided.   Follow Up Instructions: I discussed the assessment and treatment plan with the patient. The patient was provided an opportunity to ask questions and all were answered. The patient agreed with the plan and demonstrated an understanding of the instructions.  A copy of instructions were sent to the patient via MyChart unless otherwise noted below.     The patient was advised to call back or seek an in-person evaluation if the symptoms worsen or if the condition fails to improve as anticipated.    Char Common Ward, PA-C
